# Patient Record
Sex: Male | Born: 1965 | Race: White | Hispanic: No | Marital: Married | Smoking: Never smoker
Health system: Southern US, Community
[De-identification: ages and names within clinical notes are randomized; demographics above are authoritative.]

## PROBLEM LIST (undated history)

## (undated) DIAGNOSIS — R42 Dizziness and giddiness: Secondary | ICD-10-CM

## (undated) DIAGNOSIS — E785 Hyperlipidemia, unspecified: Secondary | ICD-10-CM

## (undated) DIAGNOSIS — I493 Ventricular premature depolarization: Secondary | ICD-10-CM

## (undated) DIAGNOSIS — I491 Atrial premature depolarization: Secondary | ICD-10-CM

## (undated) HISTORY — DX: Atrial premature depolarization: I49.1

## (undated) HISTORY — DX: Dizziness and giddiness: R42

## (undated) HISTORY — PX: OTHER SURGICAL HISTORY: SHX169

## (undated) HISTORY — DX: Ventricular premature depolarization: I49.3

## (undated) HISTORY — DX: Hyperlipidemia, unspecified: E78.5

## (undated) HISTORY — PX: VASECTOMY: SHX75

---

## 2001-11-26 ENCOUNTER — Ambulatory Visit (HOSPITAL_COMMUNITY): Admission: RE | Admit: 2001-11-26 | Discharge: 2001-11-26 | Payer: Self-pay | Admitting: Orthopedic Surgery

## 2002-06-13 ENCOUNTER — Encounter: Admission: RE | Admit: 2002-06-13 | Discharge: 2002-06-13 | Payer: Self-pay | Admitting: Family Medicine

## 2002-10-30 ENCOUNTER — Encounter: Admission: RE | Admit: 2002-10-30 | Discharge: 2002-10-30 | Payer: Self-pay | Admitting: Family Medicine

## 2003-10-21 ENCOUNTER — Encounter: Admission: RE | Admit: 2003-10-21 | Discharge: 2003-10-21 | Payer: Self-pay | Admitting: Family Medicine

## 2005-01-31 ENCOUNTER — Emergency Department (HOSPITAL_COMMUNITY): Admission: EM | Admit: 2005-01-31 | Discharge: 2005-01-31 | Payer: Self-pay | Admitting: Family Medicine

## 2008-06-17 ENCOUNTER — Ambulatory Visit: Payer: Self-pay | Admitting: Internal Medicine

## 2008-07-02 ENCOUNTER — Ambulatory Visit: Payer: Self-pay | Admitting: Internal Medicine

## 2008-07-02 ENCOUNTER — Encounter: Payer: Self-pay | Admitting: Internal Medicine

## 2008-07-02 ENCOUNTER — Ambulatory Visit (HOSPITAL_COMMUNITY): Admission: RE | Admit: 2008-07-02 | Discharge: 2008-07-02 | Payer: Self-pay | Admitting: Internal Medicine

## 2010-07-07 DIAGNOSIS — D229 Melanocytic nevi, unspecified: Secondary | ICD-10-CM

## 2010-07-07 HISTORY — DX: Melanocytic nevi, unspecified: D22.9

## 2011-04-11 NOTE — H&P (Signed)
NAME:  Warren Barr, Warren Barr                ACCOUNT NO.:  [id number]   MEDICAL RECORD NO.:  [id number]          PATIENT TYPE:  AMB   LOCATION:  DAY                           FACILITY:  [facility]   PHYSICIAN:  R. Roetta Sessions, M.D. DATE OF BIRTH:  1966/11/22   DATE OF ADMISSION:  DATE OF DISCHARGE:  LH                              HISTORY & PHYSICAL   CHIEF COMPLAINT:  Blood on toilet tissue.   HISTORY OF PRESENT ILLNESS:  The patient is very pleasant 45 year old  Caucasian gentleman who presents today for further evaluation of  intermittent toilet tissue hematochezia.  He states a few years ago he  was told that he had hemorrhoids.  He has never had a colonoscopy.  He  has intermittent blood per rectum which is bright red.  He denies any  constipation, diarrhea, rectal pain, abdominal pain, nausea, vomiting,  heartburn, dysphagia, or odynophagia.  His weight has been stable.   CURRENT MEDICATIONS:  None.   ALLERGIES:  No known drug allergies.   PAST MEDICAL HISTORY:  He has had surgery on both legs.   FAMILY HISTORY:  Mother, age 21, has atrial fibrillation.  Father  committed suicide at age 80.  He has a twin brother who is healthy.  He  has 2 healthy sisters.  No family history of colorectal cancer or  colorectal polyps to his knowledge.   SOCIAL HISTORY:  He is married.  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  He is a nonsmoker.  No alcohol use.   REVIEW OF SYSTEMS:  See HPI for GI.  CONSTITUTIONAL:  Denies  unintentional weight loss.  CARDIOPULMONARY:  No chest pain, shortness  of breath, palpitations, or cough.  GENITOURINARY:  No dysuria or  hematuria.   PHYSICAL EXAMINATION:  VITALS SIGNS:  Weight 209, height 6 feet 2  inches, temperature 97.9, blood pressure 110/82, and pulse 80.  GENERAL:  Pleasant well-nourished, well-developed Caucasian male in no  acute distress.  SKIN:  Warm and dry.  No jaundice.  HEENT:  Sclerae nonicteric.  Oropharyngeal  mucosa moist and pink.  No  lesions, erythema, or exudate.  No lymphadenopathy or thyromegaly.  CHEST:  Lungs are clear to auscultation.  CARDIAC:  Regular rate and rhythm.  Normal S1 and S2.  No murmurs, rubs,  or gallops.  ABDOMEN:  Positive bowel sounds.  Soft, nontender, and nondistended.  No  organomegaly or masses.  No rebound or guarding.  No abdominal bruits or  hernias.  EXTREMITIES:  Lower extremities, no edema.   IMPRESSION:  Warren Barr is a pleasant 45 year old gentleman with  intermittent hematochezia likely due to a benign anorectal source such  as hemorrhoids.  I have offered him a colonoscopy today to exclude the  possibilities of polyps, ulcers, malignancy, etc.  I have discussed  risks, alternatives, and benefits with the patient with regards to the  risk of reaction to medication, bleeding, perforation, and infection and  he is agreeable to proceed.   PLAN:  Colonoscopy in the near future with Dr. Jena Gauss.  Warren Barr, P.AJonathon Bellows, M.D.  Electronically Signed    LL/MEDQ  D:  06/17/2008  T:  06/18/2008  Job:  [id number]

## 2011-04-11 NOTE — Op Note (Signed)
NAME:  Warren Barr, Warren Barr                ACCOUNT NO.:  [id number]   MEDICAL RECORD NO.:  [id number]          PATIENT TYPE:  AMB   LOCATION:  DAY                           FACILITY:  [facility]   PHYSICIAN:  R. Roetta Sessions, M.D. DATE OF BIRTH:  03/08/1966   DATE OF PROCEDURE:  07/02/2008  DATE OF DISCHARGE:                               OPERATIVE REPORT   PROCEDURE:  Ileocolonoscopy and snare polypectomy.   INDICATIONS FOR PROCEDURE:  A 45 year old gentleman with intermittent  paper hematochezia.  No family history of colorectal neoplasia or  polyps.  No history of prior imaging.  Colonoscopy is now being done as  a diagnostic maneuver.  Risks, benefits, alternatives, and limitations  have been reviewed and questions answered.  He is agreeable.  Please see  the documentation in the medical record.   PROCEDURE NOTE:  O2 saturation, blood pressure, pulse, and respirations  monitored throughout the entire procedure.   CONSCIOUS SEDATION:  Versed 7 mg IV and Demerol 125 mg IV in divided  doses.   INSTRUMENT:  Pentax video chip system.   FINDINGS:  Digital rectal exam revealed no abnormalities aside from a  single external hemorrhoid tag.  Endoscopic findings:  The prep was  good.  Colon:  Colonic mucosa was surveyed from the rectosigmoid  junction to the left transverse and right colon, to the appendiceal  orifice, ileocecal valve, and cecum.  These structures were well seen  and photographed for the record.  Terminal ileum was intubated to 10 cm.  From this level, scope was slowly cautiously withdrawn.  All previously  mentioned mucosal surfaces were again seen.  The patient had a 5-mm  pedunculated polyp in the mid descending colon.  It was cold snared and  recovered with the scope.  Remainder of the colonic mucosa appeared  normal.  The scope was pulled down to the rectum with examination of  rectal mucosa, including retroflexed view of the anal verge,  demonstrated no abnormalities.  The  patient tolerated the procedure well  and was reacted in Endoscopy.   IMPRESSION:  1. Single __________ hemorrhoidal tag, otherwise normal rectum.  2. Polyp in mid descending colon, status post snare removal as      described above.  Remainder of the colonic mucosa and terminal      mucosa appeared normal.   RECOMMENDATIONS:  1. Follow up on path.  2. Hemorrhoid and high-fiber diet literature provided to [name].      Specifically I have recommended a dose of      Benefiber daily.  3. A 10-day course of Anusol-HC Suppository one per rectum at bedtime.      He is to void straining and vigorous wiping.  Further      recommendations to follow.      Warren Barr, M.D.  Electronically Signed     RMR/MEDQ  D:  07/02/2008  T:  07/02/2008  Job:  [id number]   cc:   Warren Barr  Fax: [phone]

## 2011-04-14 NOTE — Procedures (Signed)
Warren Barr. [facility]  Patient:    Warren Barr, Warren Barr Visit Number: [id number] MRN: [id number]          Service Type: DSU Location: DAY Attending Physician:  Sherri Rad Dictated by:   Sherri Rad, M.D. Proc. 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  PROCEDURE:  Bilateral gastrocnemius slide.  ANESTHESIA:  General endotracheal tube.  SURGEON:  Sherri Rad, M.D.  ASSISTANTJill Side P. Mahar, P.A.  ESTIMATED BLOOD LOSS:  Minimal.  TOURNIQUET:  None.  COMPLICATIONS:  None.  DISPOSITION:  Stable to PAR.  INDICATION:  This is a 45 year old gentleman who has had bilateral plantar medial heel pain for almost one year.  He has tried all types of conservative management, which includes cortisone heel injections, stretching, physical therapy, anti-inflammatories, heel cups, night splints, all of which have not helped.  He works in the [employer] and is on his feet throughout the day. He has tried mobilization in a Personal assistant during this time, which has not helped as well.  He was explained his options, which include surgery, which would be either gastroc slide versus plantar fascial release versus orthotripsy.  He was explained the difference of the three and chose gastroc slide at this point.  He wanted to have both done as well at this time, for they both hurt about the same.  He was explained the risks of the procedure, which include infection, nerve or vessel injury, especially sural nerve numbness, pain, worsening of pain, the persistent heel pain that would slowly abate over time most likely.  The possibility that his heel pain may actually continue was also explained as  well.  He was also explained that he may need future surgical procedures if this did not work.  He was in agreement with the above, and all questions were answered.  He was also told that he was going to have some slight weakness in his calf muscle as well that may not be interpretable.  DESCRIPTION OF PROCEDURE:  Patient brought to the operating room and placed in supine position.  After adequate general endotracheal tube anesthesia was administered as well as Ancef 1 g IV piggyback, bilateral lower extremities were prepped and draped in a sterile manner without thigh tourniquets.  We started the procedure on the right lower extremity with a longitudinal incision at the musculotendinous junction of the gastrocnemius on the medial border.  Dissection was carried down through skin.  Hemostasis was obtained. The fascia was then opened in the line of the incision.  The interval between the gastroc-soleus conjoined tendon region was identified and developed.  The sural nerve was identified on the posterior soft tissue structures and elevated off the posterior aspect of the gastroc tendinous area and was protected with retractor throughout the case.  Then with the Mayo scissors, the gastroc tendon was then released.  This had an excellent release of the tight gastroc with improvement of about 10-15 degrees of dorsiflexion with the knee extended beyond neutral.  The wound was copiously irrigated with normal saline.  Skin was closed with 3-0 subcu and 4-0 Monocryl subcuticular stitch. A similar exact procedure was done  on the opposite extremity.  Sterile dressings were applied.  Cam walker boots were applied.  The patient was stable to the PAR. Dictated by:   Sherri Rad, M.D. Attending Physician:  Sherri Rad DD:  11/26/01 TD:  11/26/01 Job: [id number]

## 2012-01-30 DIAGNOSIS — F411 Generalized anxiety disorder: Secondary | ICD-10-CM | POA: Insufficient documentation

## 2012-02-16 ENCOUNTER — Institutional Professional Consult (permissible substitution): Payer: Self-pay | Admitting: Cardiology

## 2012-02-26 ENCOUNTER — Encounter: Payer: Self-pay | Admitting: Cardiovascular Disease

## 2012-02-26 ENCOUNTER — Ambulatory Visit (INDEPENDENT_AMBULATORY_CARE_PROVIDER_SITE_OTHER): Payer: BC Managed Care – PPO | Admitting: Cardiovascular Disease

## 2012-02-26 VITALS — BP 129/79 | HR 71 | Ht 74.0 in | Wt 208.0 lb

## 2012-02-26 DIAGNOSIS — R002 Palpitations: Secondary | ICD-10-CM

## 2012-02-26 NOTE — Patient Instructions (Signed)
Your physician recommends that you schedule a follow-up appointment in: 2-3 weeks.   Your physician has requested that you have an echocardiogram. Echocardiography is a painless test that uses sound waves to create images of your heart. It provides your doctor with information about the size and shape of your heart and how well your heart's chambers and valves are working. This procedure takes approximately one hour. There are no restrictions for this procedure.   Your physician has recommended that you wear a holter monitor. Holter monitors are medical devices that record the heart's electrical activity. Doctors most often use these monitors to diagnose arrhythmias. Arrhythmias are problems with the speed or rhythm of the heartbeat. The monitor is a small, portable device. You can wear one while you do your normal daily activities. This is usually used to diagnose what is causing palpitations/syncope (passing out).    

## 2012-02-26 NOTE — Assessment & Plan Note (Signed)
Likely premature beats. Will arrange 48 hour Holter monitor and echo to exclude structural heart disease. He does not have risk factors for CAD.

## 2012-02-26 NOTE — Progress Notes (Signed)
   History of Present Illness: 46 yo male with no past medical history who is referred today for evaluation of chest pain. He tells me that he has had several episodes of SOB, sudden occurrence. This has happened mostly at rest. He had one episode of chest tightness while driving. This did not feel like acid reflux. This occurred in January and has not recurred. He does notice his heart seems to be fluttering during these episodes. No near syncope or syncope.   Primary Care Physician: Almond Lint  Last Lipid Profile:  Past Medical History  Diagnosis Date  . Chest tightness   . Dizziness   . Anxiety states     Past Surgica l History  Procedure Date  . Plantar fasciitis     Bilateral    No current outpatient prescriptions on file.    No Known Allergies  History   Social History  . Marital Status: Married    Spouse Name: N/A    Number of Children: 2  . Years of Education: N/A   Occupational History  . Occupational hygienist   Social History Main Topics  . Smoking status: Never Smoker   . Smokeless tobacco: Not on file  . Alcohol Use: No  . Drug Use: No  . Sexually Active: Not on file   Other Topics Concern  . Not on file   Social History Narrative  . No narrative on file    Family History  Problem Relation Age of Onset  . Heart failure Father     Review of Systems:  As stated in the HPI and otherwise negative.   BP 129/79  Pulse 71  Ht 6\' 2"  (1.88 m)  Wt 208 lb (94.348 kg)  BMI 26.71 kg/m2  Physical Examination: General: Well developed, well nourished, NAD HEENT: OP clear, mucus membranes moist SKIN: warm, dry. No rashes. Neuro: No focal deficits Musculoskeletal: Muscle strength 5/5 all ext Psychiatric: Mood and affect normal Neck: No JVD, no carotid bruits, no thyromegaly, no lymphadenopathy. Lungs:Clear bilaterally, no wheezes, rhonci, crackles Cardiovascular: Regular rate and rhythm. No murmurs, gallops or rubs. Abdomen:Soft. Bowel  sounds present. Non-tender.  Extremities: No lower extremity edema. Pulses are 2 + in the bilateral DP/PT.  EKG: NSR, rate 71 bpm. Normal EKG.

## 2012-02-29 ENCOUNTER — Encounter (INDEPENDENT_AMBULATORY_CARE_PROVIDER_SITE_OTHER): Payer: BC Managed Care – PPO

## 2012-02-29 ENCOUNTER — Ambulatory Visit (HOSPITAL_COMMUNITY): Payer: BC Managed Care – PPO | Attending: Cardiovascular Disease

## 2012-02-29 ENCOUNTER — Other Ambulatory Visit: Payer: Self-pay

## 2012-02-29 DIAGNOSIS — R002 Palpitations: Secondary | ICD-10-CM | POA: Insufficient documentation

## 2012-03-05 ENCOUNTER — Encounter: Payer: Self-pay | Admitting: Cardiology

## 2012-03-12 ENCOUNTER — Telehealth: Payer: Self-pay | Admitting: *Deleted

## 2012-03-12 NOTE — Telephone Encounter (Signed)
Fu call °Patient returning your call °

## 2012-03-12 NOTE — Telephone Encounter (Signed)
Spoke with pt and reviewed monitor results with pt.

## 2012-03-12 NOTE — Telephone Encounter (Signed)
Called pt to review monitor results. Left message to call back 

## 2012-03-28 ENCOUNTER — Ambulatory Visit (INDEPENDENT_AMBULATORY_CARE_PROVIDER_SITE_OTHER): Payer: BC Managed Care – PPO | Admitting: Cardiovascular Disease

## 2012-03-28 ENCOUNTER — Ambulatory Visit: Payer: BC Managed Care – PPO | Admitting: Cardiovascular Disease

## 2012-03-28 ENCOUNTER — Encounter: Payer: Self-pay | Admitting: Cardiovascular Disease

## 2012-03-28 VITALS — BP 128/84 | HR 80 | Ht 74.0 in | Wt 207.0 lb

## 2012-03-28 DIAGNOSIS — I493 Ventricular premature depolarization: Secondary | ICD-10-CM

## 2012-03-28 DIAGNOSIS — R002 Palpitations: Secondary | ICD-10-CM

## 2012-03-28 DIAGNOSIS — I4949 Other premature depolarization: Secondary | ICD-10-CM

## 2012-03-28 MED ORDER — DILTIAZEM HCL ER COATED BEADS 120 MG PO CP24: 120.0000 mg | ORAL_CAPSULE | Freq: Every day | ORAL | Status: DC

## 2012-03-28 MED ORDER — ALPRAZOLAM 0.25 MG PO TABS: ORAL_TABLET | ORAL | Status: DC

## 2012-03-28 NOTE — Progress Notes (Signed)
   History of Present Illness: 46 yo male with no past medical history who is here today for cardiac follow up. I saw him one month ago for evaluation of chest pain. He told me that he has had several episodes of SOB, sudden occurrence. This has happened mostly at rest. He had one episode of chest tightness while driving. This did not feel like acid reflux. This occurred in January and did not  recur. He does notice his heart seems to be fluttering during these episodes. No near syncope or syncope. I arranged an echo which showed normal LV systolic function with LVEF of 60%. No valvular disease. His 48 hour Holter monitor showed NSR with 23,000 PVCs and 240 PACs.  He tells me today that he is still feeling his heart racing. He is under much stress as a Engineer, site. No chest pain or SOB. No near syncope or syncope.   Primary Care Physician: Donzetta Sprung   Past Medical History  Diagnosis Date  . Chest tightness   . Dizziness   . Anxiety states     Past Surgical History  Procedure Date  . Plantar fasciitis     Bilateral    No current outpatient prescriptions on file.    No Known Allergies  History   Social History  . Marital Status: Married    Spouse Name: N/A    Number of Children: 2  . Years of Education: N/A   Occupational History  . Occupational hygienist   Social History Main Topics  . Smoking status: Never Smoker   . Smokeless tobacco: Not on file  . Alcohol Use: No  . Drug Use: No  . Sexually Active: Not on file   Other Topics Concern  . Not on file   Social History Narrative  . No narrative on file    Family History  Problem Relation Age of Onset  . Heart failure Father     Review of Systems:  As stated in the HPI and otherwise negative.   BP 128/84  Pulse 80  Ht 6\' 2"  (1.88 m)  Wt 207 lb (93.895 kg)  BMI 26.58 kg/m2  Physical Examination: General: Well developed, well nourished, NAD HEENT: OP clear, mucus membranes moist SKIN: warm,  dry. No rashes. Neuro: No focal deficits Musculoskeletal: Muscle strength 5/5 all ext Psychiatric: Mood and affect normal Neck: No JVD, no carotid bruits, no thyromegaly, no lymphadenopathy. Lungs:Clear bilaterally, no wheezes, rhonci, crackles Cardiovascular: Regular rate and rhythm. No murmurs, gallops or rubs. Abdomen:Soft. Bowel sounds present. Non-tender.  Extremities: No lower extremity edema. Pulses are 2 + in the bilateral DP/PT.  48 Holter monitor: Normal sinus rhythm. 65,784 PVCs. 240 PACs. No VT.

## 2012-03-28 NOTE — Assessment & Plan Note (Signed)
Will start Cardizem CD 120 mg po Qdaily. He will let us know if this does not reduce his symptoms. We will adjust dose as needed. Will also give him Xanax 0.25 mg po Q6hours prn anxiety which is playing a role in this.

## 2012-03-28 NOTE — Patient Instructions (Signed)
Your physician wants you to follow-up in: 12 months. You will receive a reminder letter in the mail two months in advance. If you don't receive a letter, please call our office to schedule the follow-up appointment.  Your physician has recommended you make the following change in your medication: Start Cardizem CD 120 mg by mouth daily.   

## 2012-05-07 ENCOUNTER — Encounter: Payer: Self-pay | Admitting: Cardiovascular Disease

## 2012-05-16 ENCOUNTER — Telehealth: Payer: Self-pay | Admitting: Cardiovascular Disease

## 2012-05-16 NOTE — Telephone Encounter (Signed)
Please return call to patient at [phone], concerning headaches.

## 2012-05-16 NOTE — Telephone Encounter (Signed)
Patient said that for the last 7 to 10 days he has been getting a boderson headache in the middle of the day. Pt checks his BP and heart rate and it runs normal. Allso he drinks plenty of water. Patient thinks that is the Cardizem 120 mg  He takes this  Medication once a day for about forty days. Pt is aware that I will send this message to the MD.

## 2012-05-17 NOTE — Telephone Encounter (Signed)
OK to have him hold his Cardizem for a few days and see if the headaches go away. If his palpitations worsen, we could try a beta blocker instead, would probably try Toprol XL 25 mg po Qdaily. Thanks, chris

## 2012-05-17 NOTE — Telephone Encounter (Signed)
Call back number listed below is not correct. I called pt at home number and gave him instructions from Dr. Clifton James. He will let us know if headaches improve off Cardizem and if palpitations are bothersome and he would like to change to Toprol.

## 2013-04-03 ENCOUNTER — Other Ambulatory Visit: Payer: Self-pay

## 2013-04-03 ENCOUNTER — Encounter: Payer: Self-pay | Admitting: Cardiovascular Disease

## 2013-04-03 ENCOUNTER — Ambulatory Visit (INDEPENDENT_AMBULATORY_CARE_PROVIDER_SITE_OTHER): Payer: BC Managed Care – PPO | Admitting: Cardiovascular Disease

## 2013-04-03 VITALS — BP 124/79 | HR 73 | Ht 74.0 in | Wt 214.0 lb

## 2013-04-03 DIAGNOSIS — I493 Ventricular premature depolarization: Secondary | ICD-10-CM

## 2013-04-03 DIAGNOSIS — I4949 Other premature depolarization: Secondary | ICD-10-CM

## 2013-04-03 DIAGNOSIS — F419 Anxiety disorder, unspecified: Secondary | ICD-10-CM

## 2013-04-03 DIAGNOSIS — F411 Generalized anxiety disorder: Secondary | ICD-10-CM

## 2013-04-03 DIAGNOSIS — R002 Palpitations: Secondary | ICD-10-CM

## 2013-04-03 MED ORDER — DILTIAZEM HCL ER COATED BEADS 120 MG PO CP24: 120.0000 mg | ORAL_CAPSULE | Freq: Every day | ORAL | Status: DC

## 2013-04-03 MED ORDER — ALPRAZOLAM 0.25 MG PO TABS: ORAL_TABLET | ORAL | Status: DC

## 2013-04-03 NOTE — Patient Instructions (Addendum)
Your physician wants you to follow-up in:  12 months.  You will receive a reminder letter in the mail two months in advance. If you don't receive a letter, please call our office to schedule the follow-up appointment.   

## 2013-04-03 NOTE — Progress Notes (Signed)
   History of Present Illness: 47 yo male with history of chest pain, palpitations secondary to PVCs/PACs who is here today for cardiac follow up. I saw him as a new patient 02/26/12 for evaluation of chest pain and palpitations. I arranged an echo which showed normal LV systolic function with LVEF of 60%. No valvular disease. His 48 hour Holter monitor showed NSR with 23,000 PVCs and 240 PACs.   He is here today for follow up. He is feeling great. No chest pain or SOB. He tells me today that he is still feeling his heart racing. 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   Primary Care Physician: Donzetta Sprung  Past Medical History  Diagnosis Date  . Chest tightness   . Dizziness   . Anxiety states   . PVC (premature ventricular contraction)     Past Surgical History  Procedure Laterality Date  . Plantar fasciitis      Bilateral    Current Outpatient Prescriptions  Medication Sig Dispense Refill  . ALPRAZolam (XANAX) 0.25 MG tablet Take one every 6 hours as needed for anxiety  30 tablet  2  . diltiazem (CARDIZEM CD) 120 MG 24 hr capsule Take 1 capsule (120 mg total) by mouth daily.  30 capsule  11   No current facility-administered medications for this visit.    No Known Allergies  History   Social History  . Marital Status: Married    Spouse Name: N/A    Number of Children: 2  . Years of Education: N/A   Occupational History  . Occupational hygienist   Social History Main Topics  . Smoking status: Never Smoker   . Smokeless tobacco: Not on file  . Alcohol Use: No  . Drug Use: No  . Sexually Active: Not on file   Other Topics Concern  . Not on file   Social History Narrative  . No narrative on file    Family History  Problem Relation Age of Onset  . Heart failure Father     Review of Systems:  As stated in the HPI and otherwise negative.   BP 124/79  Pulse 73  Ht 6\' 2"  (1.88 m)  Wt 214 lb (97.07 kg)  BMI 27.46  kg/m2  Physical Examination: General: Well developed, well nourished, NAD HEENT: OP clear, mucus membranes moist SKIN: warm, dry. No rashes. Neuro: No focal deficits Musculoskeletal: Muscle strength 5/5 all ext Psychiatric: Mood and affect normal Neck: No JVD, no carotid bruits, no thyromegaly, no lymphadenopathy. Lungs:Clear bilaterally, no wheezes, rhonci, crackles Cardiovascular: Regular rate and rhythm. No murmurs, gallops or rubs. Abdomen:Soft. Bowel sounds present. Non-tender.  Extremities: No lower extremity edema. Pulses are 2 + in the bilateral DP/PT.  EKG: NSR, rate 71 bpm. Incomplete RBBB.   Assessment and Plan:   1. Palpitations: known to have PVCs/PACs. Continue Cardizem CD 120 mg po Qdaily. He uses Xanax prn for anxiety.

## 2013-07-03 ENCOUNTER — Other Ambulatory Visit: Payer: Self-pay | Admitting: Dermatology

## 2014-04-27 ENCOUNTER — Other Ambulatory Visit: Payer: Self-pay | Admitting: Cardiovascular Disease

## 2014-04-30 ENCOUNTER — Ambulatory Visit (INDEPENDENT_AMBULATORY_CARE_PROVIDER_SITE_OTHER): Payer: BC Managed Care – PPO | Admitting: Cardiovascular Disease

## 2014-04-30 ENCOUNTER — Encounter: Payer: Self-pay | Admitting: Cardiovascular Disease

## 2014-04-30 VITALS — BP 106/66 | HR 65 | Ht 74.0 in | Wt 215.0 lb

## 2014-04-30 DIAGNOSIS — R002 Palpitations: Secondary | ICD-10-CM

## 2014-04-30 DIAGNOSIS — I493 Ventricular premature depolarization: Secondary | ICD-10-CM

## 2014-04-30 DIAGNOSIS — F411 Generalized anxiety disorder: Secondary | ICD-10-CM

## 2014-04-30 DIAGNOSIS — F419 Anxiety disorder, unspecified: Secondary | ICD-10-CM

## 2014-04-30 DIAGNOSIS — I4949 Other premature depolarization: Secondary | ICD-10-CM

## 2014-04-30 MED ORDER — DILTIAZEM HCL ER COATED BEADS 120 MG PO CP24: ORAL_CAPSULE | ORAL | Status: DC

## 2014-04-30 MED ORDER — ALPRAZOLAM 0.25 MG PO TABS: ORAL_TABLET | ORAL | Status: DC

## 2014-04-30 NOTE — Progress Notes (Signed)
    History of Present Illness: 48 yo male with history of chest pain, palpitations secondary to PVCs/PACs who is here today for cardiac follow up. I saw him as a new patient 02/26/12 for evaluation of chest pain and palpitations. I arranged an echo which showed normal LV systolic function with LVEF of 60%. No valvular disease. His 48 hour Holter monitor showed NSR with 23,000 PVCs and 240 PACs.   He is here today for follow up. He is feeling great. No chest pain or SOB. He has rare palpitations. He is a Education officer, museum. No near syncope or syncope.   Primary Care Physician: Gar Ponto  Past Medical History  Diagnosis Date  . Chest tightness   . Dizziness   . Anxiety states   . PVC (premature ventricular contraction)     Past Surgical History  Procedure Laterality Date  . Plantar fasciitis      Bilateral    Current Outpatient Prescriptions  Medication Sig Dispense Refill  . ALPRAZolam (XANAX) 0.25 MG tablet Take one every 6 hours as needed for anxiety  60 tablet  2  . CARTIA XT 120 MG 24 hr capsule TAKE 1 CAPSULE BY MOUTH DAILY  30 capsule  0   No current facility-administered medications for this visit.    No Known Allergies  History   Social History  . Marital Status: Married    Spouse Name: N/A    Number of Children: 2  . Years of Education: N/A   Occupational History  . Chief Technology Officer   Social History Main Topics  . Smoking status: Never Smoker   . Smokeless tobacco: Not on file  . Alcohol Use: No  . Drug Use: No  . Sexual Activity: Not on file   Other Topics Concern  . Not on file   Social History Narrative  . No narrative on file    Family History  Problem Relation Age of Onset  . Heart failure Father     Review of Systems:  As stated in the HPI and otherwise negative.   BP 106/66  Pulse 65  Ht 6\' 2"  (1.88 m)  Wt 215 lb (97.523 kg)  BMI 27.59 kg/m2  Physical Examination: General: Well developed, well nourished, NAD HEENT: OP  clear, mucus membranes moist SKIN: warm, dry. No rashes. Neuro: No focal deficits Musculoskeletal: Muscle strength 5/5 all ext Psychiatric: Mood and affect normal Neck: No JVD, no carotid bruits, no thyromegaly, no lymphadenopathy. Lungs:Clear bilaterally, no wheezes, rhonci, crackles Cardiovascular: Regular rate and rhythm. No murmurs, gallops or rubs. Abdomen:Soft. Bowel sounds present. Non-tender.  Extremities: No lower extremity edema. Pulses are 2 + in the bilateral DP/PT.  EKG: NSR, rate 65 bpm. Incomplete RBBB.   Assessment and Plan:   1. Palpitations: known to have PVCs/PACs. Continue Cardizem CD 120 mg po Qdaily. He uses Xanax prn for anxiety.   2. Anxiety: Will continue Xanax prn.

## 2014-04-30 NOTE — Patient Instructions (Signed)
Your physician wants you to follow-up in:  12 months.  You will receive a reminder letter in the mail two months in advance. If you don't receive a letter, please call our office to schedule the follow-up appointment.   

## 2014-05-19 ENCOUNTER — Other Ambulatory Visit: Payer: Self-pay | Admitting: Physician Assistant

## 2014-07-03 ENCOUNTER — Other Ambulatory Visit: Payer: Self-pay | Admitting: Cardiovascular Disease

## 2015-01-27 ENCOUNTER — Other Ambulatory Visit: Payer: Self-pay | Admitting: Dermatology

## 2015-04-06 ENCOUNTER — Telehealth: Payer: Self-pay | Admitting: Cardiovascular Disease

## 2015-04-06 NOTE — Telephone Encounter (Signed)
Per Dr. Angelena Form OK to proceed. Paperwork completed and faxed to [facility].  Message left on Wilda's voicemail with this information.

## 2015-04-06 NOTE — Telephone Encounter (Signed)
Request for clearance:  Request for a approval to use bio feed back to rehabilitate his pelvic floor muscles. She will fax the request

## 2015-05-21 ENCOUNTER — Ambulatory Visit (INDEPENDENT_AMBULATORY_CARE_PROVIDER_SITE_OTHER): Payer: BC Managed Care – PPO | Admitting: Cardiovascular Disease

## 2015-05-21 ENCOUNTER — Encounter: Payer: Self-pay | Admitting: Cardiovascular Disease

## 2015-05-21 VITALS — BP 118/78 | HR 71 | Ht 74.0 in | Wt 211.2 lb

## 2015-05-21 DIAGNOSIS — R002 Palpitations: Secondary | ICD-10-CM

## 2015-05-21 DIAGNOSIS — I493 Ventricular premature depolarization: Secondary | ICD-10-CM | POA: Diagnosis not present

## 2015-05-21 DIAGNOSIS — F419 Anxiety disorder, unspecified: Secondary | ICD-10-CM | POA: Diagnosis not present

## 2015-05-21 MED ORDER — ALPRAZOLAM 0.25 MG PO TABS
ORAL_TABLET | ORAL | Status: DC
Start: 2015-05-21 — End: 2015-12-03

## 2015-05-21 MED ORDER — DILTIAZEM HCL ER COATED BEADS 120 MG PO CP24: 120.0000 mg | ORAL_CAPSULE | Freq: Every day | ORAL | Status: DC

## 2015-05-21 NOTE — Patient Instructions (Signed)
Medication Instructions:  Your physician recommends that you continue on your current medications as directed. Please refer to the Current Medication list given to you today.   Labwork: none  Testing/Procedures: none  Follow-Up: Your physician wants you to follow-up in:  12 months.  You will receive a reminder letter in the mail two months in advance. If you don't receive a letter, please call our office to schedule the follow-up appointment.        

## 2015-05-21 NOTE — Progress Notes (Signed)
Chief Complaint  Patient presents with  . Follow-up     History of Present Illness: 49 yo male with history of chest pain, palpitations secondary to PVCs/PACs who is here today for cardiac follow up. I saw him as a new patient 02/26/12 for evaluation of chest pain and palpitations. I arranged an echo which showed normal LV systolic function with LVEF of 60%. No valvular disease. His 48 hour Holter monitor showed NSR with 23,000 PVCs and 240 PACs.   He is here today for follow up. He is feeling great. No chest pain or SOB. He has rare palpitations. No near syncope or syncope.   Primary Care Physician: [facility]   Past Medical History  Diagnosis Date  . Chest tightness   . Dizziness   . Anxiety states   . PVC (premature ventricular contraction)     Past Surgical History  Procedure Laterality Date  . Plantar fasciitis      Bilateral    Current Outpatient Prescriptions  Medication Sig Dispense Refill  . ALPRAZolam (XANAX) 0.25 MG tablet Take one every 6 hours as needed for anxiety 60 tablet 3  . diltiazem (CARDIZEM CD) 120 MG 24 hr capsule TAKE 1 CAPSULE BY MOUTH DAILY     No current facility-administered medications for this visit.    No Known Allergies  History   Social History  . Marital Status: Married    Spouse Name: N/A  . Number of Children: 2  . Years of Education: N/A   Occupational History  . Chief Technology Officer   Social History Main Topics  . Smoking status: Never Smoker   . Smokeless tobacco: Not on file  . Alcohol Use: No  . Drug Use: No  . Sexual Activity: Not on file   Other Topics Concern  . Not on file   Social History Narrative    Family History  Problem Relation Age of Onset  . Heart failure Father     Review of Systems:  As stated in the HPI and otherwise negative.   BP 118/78 mmHg  Pulse 71  Ht 6\' 2"  (1.88 m)  Wt 211 lb 3.2 oz (95.8 kg)  BMI 27.11 kg/m2  SpO2 98%  Physical Examination: General: Well developed, well  nourished, NAD HEENT: OP clear, mucus membranes moist SKIN: warm, dry. No rashes. Neuro: No focal deficits Musculoskeletal: Muscle strength 5/5 all ext Psychiatric: Mood and affect normal Neck: No JVD, no carotid bruits, no thyromegaly, no lymphadenopathy. Lungs:Clear bilaterally, no wheezes, rhonci, crackles Cardiovascular: Regular rate and rhythm. No murmurs, gallops or rubs. Abdomen:Soft. Bowel sounds present. Non-tender.  Extremities: No lower extremity edema. Pulses are 2 + in the bilateral DP/PT.  EKG:  EKG is ordered today. The ekg ordered today demonstrates NSR, rate 71 bpm. Incomplete RBBB  Recent Labs: No results found for requested labs within last 365 days.   Lipid Panel No results found for: CHOL, TRIG, HDL, CHOLHDL, VLDL, LDLCALC, LDLDIRECT   Wt Readings from Last 3 Encounters:  05/21/15 211 lb 3.2 oz (95.8 kg)  04/30/14 215 lb (97.523 kg)  04/03/13 214 lb (97.07 kg)     Other studies Reviewed: Additional studies/ records that were reviewed today include: . Review of the above records demonstrates:    Assessment and Plan:   1. Palpitations: known to have PVCs/PACs. Continue Cardizem CD 120 mg po Qdaily. He uses Xanax prn for anxiety.   2. Anxiety: Will continue Xanax prn.  Current medicines are  reviewed at length with the patient today.  The patient does not have concerns regarding medicines.  The following changes have been made:  no change  Labs/ tests ordered today include:  No orders of the defined types were placed in this encounter.    Disposition:   FU with me in 12  months  Signed, Lauree Chandler, MD 05/21/2015 3:01 PM    [facility] [place], [place], [place]  [address] Phone: [phone]; Fax: [phone]

## 2015-05-26 ENCOUNTER — Ambulatory Visit: Payer: BC Managed Care – PPO | Admitting: Cardiovascular Disease

## 2015-05-28 ENCOUNTER — Other Ambulatory Visit: Payer: Self-pay

## 2015-11-18 ENCOUNTER — Other Ambulatory Visit: Payer: Self-pay

## 2015-11-18 ENCOUNTER — Ambulatory Visit (INDEPENDENT_AMBULATORY_CARE_PROVIDER_SITE_OTHER): Payer: BC Managed Care – PPO | Admitting: Nurse Practitioner

## 2015-11-18 ENCOUNTER — Encounter: Payer: Self-pay | Admitting: Nurse Practitioner

## 2015-11-18 VITALS — BP 126/82 | HR 87 | Temp 97.3°F | Ht 74.0 in | Wt 210.6 lb

## 2015-11-18 DIAGNOSIS — K6289 Other specified diseases of anus and rectum: Secondary | ICD-10-CM | POA: Diagnosis not present

## 2015-11-18 DIAGNOSIS — K921 Melena: Secondary | ICD-10-CM | POA: Diagnosis not present

## 2015-11-18 MED ORDER — PEG 3350-KCL-NA BICARB-NACL 420 G PO SOLR: 4000.0000 mL | ORAL | Status: DC

## 2015-11-18 NOTE — Progress Notes (Signed)
Primary Care Physician:  No PCP Per Patient Primary Gastroenterologist:  Dr. Gala Romney  Chief Complaint  Patient presents with  . Rectal Pain  . set up TCS    HPI:   49 year old male presents for chief complaint of rectal pain and possible need for colonoscopy. Records provided with his chart are notes from [facility] with no indication of rectal pain or need for colonoscopy). There was a CT stone protocol from 12/21/2014 which found hepatobiliary unremarkable stomach and bowel unremarkable, impression of no findings to explain patient's symptoms. While taking through the electronic medical record it was noted he was seen by our practice in 2009 for toilet tissue hematochezia and has subsequent colonoscopy which found a single hemorrhoid tag, otherwise normal rectum. Polyp mid descending colon status post removal with the remainder of the colonic and terminal mucosa normal. He was given ten-day Anusol suppository course. Surgical pathology found hyperplastic polyp.  Today he states his rectal pain started about a year ago. Saw urgent care due to inability to urinate. Followed up with urology who has been seeing him. Has worsening perineal pain and rectal when sitting on hard surfaces. Urology diagnosed him with pelvic floor pain, completed physical therapy with significant improvement. Is also having toilet tissue hematochezia which also began again in the past year. Denies melena, abdominal pain, N/V, fever, chills, unintentional weight loss. Denies chest pain, dyspnea, dizziness, lightheadedness, syncope, near syncope. Denies any other upper or lower GI symptoms.  Past Medical History  Diagnosis Date  . Chest tightness   . Dizziness   . Anxiety states   . PVC (premature ventricular contraction)     Past Surgical History  Procedure Laterality Date  . Plantar fasciitis      Bilateral    Current Outpatient Prescriptions  Medication Sig Dispense Refill  . ALPRAZolam (XANAX) 0.25  MG tablet Take one every 6 hours as needed for anxiety 30 tablet 3  . diltiazem (CARDIZEM CD) 120 MG 24 hr capsule Take 1 capsule (120 mg total) by mouth daily. 30 capsule 11   No current facility-administered medications for this visit.    Allergies as of 11/18/2015  . (No Known Allergies)    Family History  Problem Relation Age of Onset  . Heart failure Father     Social History   Social History  . Marital Status: Married    Spouse Name: N/A  . Number of Children: 2  . Years of Education: N/A   Occupational History  . Chief Technology Officer   Social History Main Topics  . Smoking status: Never Smoker   . Smokeless tobacco: Not on file  . Alcohol Use: No  . Drug Use: No  . Sexual Activity: Not on file   Other Topics Concern  . Not on file   Social History Narrative    Review of Systems: General: Negative for anorexia, weight loss, fever, chills, fatigue, weakness. Eyes: Negative for vision changes.  ENT: Negative for hoarseness, difficulty swallowing. CV: Negative for chest pain, angina, palpitations, peripheral edema.  Respiratory: Negative for dyspnea at rest, cough, sputum, wheezing.  GI: See history of present illness. Derm: Negative for rash or itching.  Endo: Negative for unusual weight change.  Heme: Negative for bruising or bleeding. Allergy: Negative for rash or hives.    Physical Exam: BP 126/82 mmHg  Pulse 87  Temp(Src) 97.3 F (36.3 C)  Ht 6\' 2"  (1.88 m)  Wt 210 lb 9.6 oz (95.528 kg)  BMI 27.03 kg/m2 General:   Alert and oriented. Pleasant and cooperative. Well-nourished and well-developed.  Head:  Normocephalic and atraumatic. Eyes:  Without icterus, sclera clear and conjunctiva pink.  Ears:  Normal auditory acuity. Cardiovascular:  S1, S2 present without murmurs appreciated. Extremities without clubbing or edema. Respiratory:  Clear to auscultation bilaterally. No wheezes, rales, or rhonchi. No distress.  Gastrointestinal:  +BS,  soft, non-tender and non-distended. No HSM noted. No guarding or rebound. No masses appreciated.  Rectal:  Deferred  Skin:  Intact without significant lesions or rashes. Neurologic:  Alert and oriented x4;  grossly normal neurologically. Psych:  Alert and cooperative. Normal mood and affect. Heme/Lymph/Immune: No excessive bruising noted.    11/18/2015 10:57 AM

## 2015-11-18 NOTE — Assessment & Plan Note (Signed)
Patient with rectal bleeding at last colonoscopy approximately 9 years ago which is deemed due to hemorrhoids. His symptoms subsided and if not been recurrent since that time until approximately 1 year ago when he began having the lower abdominal, rectal, perineal pain. Given his symptoms, the time since his last colonoscopy, and the fact he is on was 49 years old proceed with a colonoscopy to further evaluate his symptoms. Hematochezia likely due to benign anorectal source however given his other symptoms cannot rule out other more insidious pathology.  Proceed with TCS with Dr. Gala Romney in near future: the risks, benefits, and alternatives have been discussed with the patient in detail. The patient states understanding and desires to proceed.  The patient is on when necessary Xanax, no anticoagulants, other anxiolytics, chronic pain medications. Drinks one glass of wine about 2 nights a week. His last procedure was completed with conscious sedation and this should again be adequate for his procedure.

## 2015-11-18 NOTE — Patient Instructions (Signed)
1. We will schedule your procedure for you. 2. Further recommendations to be based on results your procedure. 3. Return for follow-up as needed based on recommendations.

## 2015-11-18 NOTE — Assessment & Plan Note (Signed)
Patient with rectal pain and perineal pain for the past year. 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 Has completed physical therapy with some improvement in symptoms but symptoms do persist. Given the fact that his hematochezia has recurred about the same time as his rectal pain and his colonoscopy approximately 9 years ago and he is almost 49 years old we'll proceed with a colonoscopy for further evaluation. Return for follow-up based on recommendations from procedure.

## 2015-11-18 NOTE — Progress Notes (Signed)
No pcp per patient 

## 2015-12-13 ENCOUNTER — Telehealth: Payer: Self-pay | Admitting: Internal Medicine

## 2015-12-13 ENCOUNTER — Ambulatory Visit (HOSPITAL_COMMUNITY)
Admission: RE | Admit: 2015-12-13 | Discharge: 2015-12-13 | Disposition: A | Discharge status: Home / Self Care | Payer: BC Managed Care – PPO | Source: Ambulatory Visit | Attending: Internal Medicine | Admitting: Internal Medicine

## 2015-12-13 ENCOUNTER — Encounter (HOSPITAL_COMMUNITY)
Admission: RE | Disposition: A | Discharge status: Home / Self Care | Payer: Self-pay | Source: Ambulatory Visit | Attending: Internal Medicine

## 2015-12-13 ENCOUNTER — Encounter (HOSPITAL_COMMUNITY): Payer: Self-pay | Admitting: *Deleted

## 2015-12-13 DIAGNOSIS — Z860100 Personal history of colon polyps, unspecified: Secondary | ICD-10-CM | POA: Insufficient documentation

## 2015-12-13 DIAGNOSIS — K6289 Other specified diseases of anus and rectum: Secondary | ICD-10-CM | POA: Diagnosis present

## 2015-12-13 DIAGNOSIS — K921 Melena: Secondary | ICD-10-CM | POA: Insufficient documentation

## 2015-12-13 DIAGNOSIS — K635 Polyp of colon: Secondary | ICD-10-CM | POA: Diagnosis not present

## 2015-12-13 DIAGNOSIS — D122 Benign neoplasm of ascending colon: Secondary | ICD-10-CM

## 2015-12-13 DIAGNOSIS — I493 Ventricular premature depolarization: Secondary | ICD-10-CM | POA: Insufficient documentation

## 2015-12-13 DIAGNOSIS — K648 Other hemorrhoids: Secondary | ICD-10-CM | POA: Diagnosis not present

## 2015-12-13 HISTORY — PX: COLONOSCOPY: SHX5424

## 2015-12-13 SURGERY — COLONOSCOPY
Anesthesia: Moderate Sedation

## 2015-12-13 MED ORDER — ONDANSETRON HCL 4 MG/2ML IJ SOLN
INTRAMUSCULAR | Status: DC | PRN
  Administered 2015-12-13: 4 mg via INTRAVENOUS

## 2015-12-13 MED ORDER — ONDANSETRON HCL 4 MG/2ML IJ SOLN
INTRAMUSCULAR | Status: AC
  Filled 2015-12-13: qty 2

## 2015-12-13 MED ORDER — MEPERIDINE HCL 100 MG/ML IJ SOLN
INTRAMUSCULAR | Status: DC
Start: 2015-12-13 — End: 2015-12-13
  Filled 2015-12-13: qty 2

## 2015-12-13 MED ORDER — MEPERIDINE HCL 100 MG/ML IJ SOLN
INTRAMUSCULAR | Status: DC | PRN
  Administered 2015-12-13 (×2): 50 mg via INTRAVENOUS

## 2015-12-13 MED ORDER — SODIUM CHLORIDE 0.9 % IV SOLN
INTRAVENOUS | Status: DC
  Administered 2015-12-13: 1000 mL via INTRAVENOUS

## 2015-12-13 MED ORDER — MIDAZOLAM HCL 5 MG/5ML IJ SOLN
INTRAMUSCULAR | Status: AC
  Filled 2015-12-13: qty 10

## 2015-12-13 MED ORDER — MIDAZOLAM HCL 5 MG/5ML IJ SOLN
INTRAMUSCULAR | Status: DC | PRN
  Administered 2015-12-13: 2 mg via INTRAVENOUS
  Administered 2015-12-13 (×2): 1 mg via INTRAVENOUS
  Administered 2015-12-13: 2 mg via INTRAVENOUS
  Administered 2015-12-13: 1 mg via INTRAVENOUS

## 2015-12-13 MED ORDER — STERILE WATER FOR IRRIGATION IR SOLN
Status: DC | PRN
  Administered 2015-12-13: 14:00:00

## 2015-12-13 NOTE — Op Note (Signed)
[facility] [address] [place], [address]   COLONOSCOPY PROCEDURE REPORT  PATIENT: Warren Barr, Warren Barr  MR#: [id number] BIRTHDATE: [birth year] , 52  yrs. old GENDER: male ENDOSCOPIST: R.  Garfield Cornea, MD FACP [facility] REFERRED [id number]  PROCEDURE DATE:  12/15/2015 PROCEDURE:   Colonoscopy with snare polypectomy INDICATIONS:Self-limiting hematochezia. MEDICATIONS: Versed 7 mg IV and Demerol 100 mg IV in divided doses. Zofran 4 mg IV. ASA CLASS:       Class II  CONSENT: The risks, benefits, alternatives and imponderables including but not limited to bleeding, perforation as well as the possibility of a missed lesion have been reviewed.  The potential for biopsy, lesion removal, etc. have also been discussed. Questions have been answered.  All parties agreeable.  Please see the history and physical in the medical record for more information.  DESCRIPTION OF PROCEDURE:   After the risks benefits and alternatives of the procedure were thoroughly explained, informed consent was obtained.  The digital rectal exam revealed no abnormalities of the rectum.   The [id number]  endoscope was introduced through the anus and advanced to the cecum, which was identified by both the appendix and ileocecal valve. No adverse events experienced.   The quality of the prep was adequate  The instrument was then slowly withdrawn as the colon was fully examined. Estimated blood loss is zero unless otherwise noted in this procedure report.      COLON FINDINGS: Internal hemorrhoids; otherwise, normal-appearing rectal mucosa.  The patient had (1) 1.25 cm oval-shaped polyp in the mid ascending segment and an adjacent 5 mm polyp; otherwise, the remainder of the colonic mucosa appeared normal.   The above-mentioned polyps were hot and cold snare removed, respectively.  Retroflexion was performed. .  Withdrawal time=10 minutes 0 seconds.  The scope was withdrawn and the  procedure completed. COMPLICATIONS: There were no immediate complications. EBL 1 mL ENDOSCOPIC IMPRESSION: Multiple colonic polyps?"removed as described above. Internal hemorrhoids. I suspect either the patient had an acute fissure or expressed a hemorrhoidal bleed recently. At any rate, he is now asymptomatic from a GI standpoint.  RECOMMENDATIONS: Follow up on pathology. Further recommendations to follow.  eSigned:  R. Garfield Cornea, MD Rosalita Chessman [facility] December 15, 2015 2:50 PM   cc:  CPT CODES: ICD CODES:  The ICD and CPT codes recommended by this software are interpretations from the data that the clinical staff has captured with the software.  The verification of the translation of this report to the ICD and CPT codes and modifiers is the sole responsibility of the health care institution and practicing physician where this report was generated.  [employer] will not be held responsible for the validity of the ICD and CPT codes included on this report.  AMA assumes no liability for data contained or not contained herein. CPT is a Designer, television/film set of the [employer].  PATIENT NAME:  Warren Barr, Warren Barr MR#: [id number]

## 2015-12-13 NOTE — Interval H&P Note (Signed)
   History and Physical Interval Note:  12/13/2015 2:18 PM  Warren Barr  has presented today for surgery, with the diagnosis of RECTAL PAIN  The various methods of treatment have been discussed with the patient and family. After consideration of risks, benefits and other options for treatment, the patient has consented to  Procedure(s) with comments: COLONOSCOPY (N/A) - 245 - moved to 1:45 - office to notify as a surgical intervention .  The patient's history has been reviewed, patient examined, no change in status, stable for surgery.  I have reviewed the patient's chart and labs.  Questions were answered to the patient's satisfaction.     Warren Barr  Lower GI symptoms have totally resolved. Diagnostic colonoscopy today per plan.  The risks, benefits, limitations, alternatives and imponderables have been reviewed with the patient. Questions have been answered. All parties are agreeable.

## 2015-12-13 NOTE — Discharge Instructions (Signed)
Colonoscopy Discharge Instructions  Read the instructions outlined below and refer to this sheet in the next few weeks. These discharge instructions provide you with general information on caring for yourself after you leave the hospital. Your doctor may also give you specific instructions. While your treatment has been planned according to the most current medical practices available, unavoidable complications occasionally occur. If you have any problems or questions after discharge, call Dr. Gala Romney at [phone]. ACTIVITY  You may resume your regular activity, but move at a slower pace for the next 24 hours.   Take frequent rest periods for the next 24 hours.   Walking will help get rid of the air and reduce the bloated feeling in your belly (abdomen).   No driving for 24 hours (because of the medicine (anesthesia) used during the test).    Do not sign any important legal documents or operate any machinery for 24 hours (because of the anesthesia used during the test).  NUTRITION  Drink plenty of fluids.   You may resume your normal diet as instructed by your doctor.   Begin with a light meal and progress to your normal diet. Heavy or fried foods are harder to digest and may make you feel sick to your stomach (nauseated).   Avoid alcoholic beverages for 24 hours or as instructed.  MEDICATIONS  You may resume your normal medications unless your doctor tells you otherwise.  WHAT YOU CAN EXPECT TODAY  Some feelings of bloating in the abdomen.   Passage of more gas than usual.   Spotting of blood in your stool or on the toilet paper.   FINDING OUT THE RESULTS OF YOUR TEST Not all test results are available during your visit. If your test results are not back during the visit, make an appointment with your caregiver to find out the results. Do not assume everything is normal if you have not heard from your caregiver or the medical facility. It is important for you to follow up on all  of your test results.  SEEK IMMEDIATE MEDICAL ATTENTION IF:  You have more than a spotting of blood in your stool.   Your belly is swollen (abdominal distention).   You are nauseated or vomiting.   You have a temperature over 101.   You have abdominal pain or discomfort that is severe or gets worse throughout the day.   Colon polyp and hemorrhoid information provided  Further recommendations to follow pending review of pathology report  Colon Polyps Polyps are lumps of extra tissue growing inside the body. Polyps can grow in the large intestine (colon). Most colon polyps are noncancerous (benign). However, some colon polyps can become cancerous over time. Polyps that are larger than a pea may be harmful. To be safe, caregivers remove and test all polyps. CAUSES  Polyps form when mutations in the genes cause your cells to grow and divide even though no more tissue is needed. RISK FACTORS There are a number of risk factors that can increase your chances of getting colon polyps. They include:  Being older than 50 years.  Family history of colon polyps or colon cancer.  Long-term colon diseases, such as colitis or Crohn disease.  Being overweight.  Smoking.  Being inactive.  Drinking too much alcohol. SYMPTOMS  Most small polyps do not cause symptoms. If symptoms are present, they may include:  Blood in the stool. The stool may look dark red or black.  Constipation or diarrhea that lasts longer than 1  week. DIAGNOSIS People often do not know they have polyps until their caregiver finds them during a regular checkup. Your caregiver can use 4 tests to check for polyps:  Digital rectal exam. The caregiver wears gloves and feels inside the rectum. This test would find polyps only in the rectum.  Barium enema. The caregiver puts a liquid called barium into your rectum before taking X-rays of your colon. Barium makes your colon look white. Polyps are dark, so they are easy to  see in the X-ray pictures.  Sigmoidoscopy. A thin, flexible tube (sigmoidoscope) is placed into your rectum. The sigmoidoscope has a light and tiny camera in it. The caregiver uses the sigmoidoscope to look at the last third of your colon.  Colonoscopy. This test is like sigmoidoscopy, but the caregiver looks at the entire colon. This is the most common method for finding and removing polyps. TREATMENT  Any polyps will be removed during a sigmoidoscopy or colonoscopy. The polyps are then tested for cancer. PREVENTION  To help lower your risk of getting more colon polyps:  Eat plenty of fruits and vegetables. Avoid eating fatty foods.  Do not smoke.  Avoid drinking alcohol.  Exercise every day.  Lose weight if recommended by your caregiver.  Eat plenty of calcium and folate. Foods that are rich in calcium include milk, cheese, and broccoli. Foods that are rich in folate include chickpeas, kidney beans, and spinach. HOME CARE INSTRUCTIONS Keep all follow-up appointments as directed by your caregiver. You may need periodic exams to check for polyps. SEEK MEDICAL CARE IF: You notice bleeding during a bowel movement.   This information is not intended to replace advice given to you by your health care provider. Make sure you discuss any questions you have with your health care provider.   Document Released: 08/09/2004 Document Revised: 12/04/2014 Document Reviewed: 01/23/2012 Elsevier Interactive Patient Education 2016 [employer].  Hemorrhoids Hemorrhoids are swollen veins around the rectum or anus. There are two types of hemorrhoids:   Internal hemorrhoids. These occur in the veins just inside the rectum. They may poke through to the outside and become irritated and painful.  External hemorrhoids. These occur in the veins outside the anus and can be felt as a painful swelling or hard lump near the anus. CAUSES  Pregnancy.   Obesity.   Constipation or diarrhea.    Straining to have a bowel movement.   Sitting for long periods on the toilet.  Heavy lifting or other activity that caused you to strain.  Anal intercourse. SYMPTOMS   Pain.   Anal itching or irritation.   Rectal bleeding.   Fecal leakage.   Anal swelling.   One or more lumps around the anus.  DIAGNOSIS  Your caregiver may be able to diagnose hemorrhoids by visual examination. Other examinations or tests that may be performed include:   Examination of the rectal area with a gloved hand (digital rectal exam).   Examination of anal canal using a small tube (scope).   A blood test if you have lost a significant amount of blood.  A test to look inside the colon (sigmoidoscopy or colonoscopy). TREATMENT Most hemorrhoids can be treated at home. However, if symptoms do not seem to be getting better or if you have a lot of rectal bleeding, your caregiver may perform a procedure to help make the hemorrhoids get smaller or remove them completely. Possible treatments include:   Placing a rubber band at the base of the hemorrhoid to cut  off the circulation (rubber band ligation).   Injecting a chemical to shrink the hemorrhoid (sclerotherapy).   Using a tool to burn the hemorrhoid (infrared light therapy).   Surgically removing the hemorrhoid (hemorrhoidectomy).   Stapling the hemorrhoid to block blood flow to the tissue (hemorrhoid stapling).  HOME CARE INSTRUCTIONS   Eat foods with fiber, such as whole grains, beans, nuts, fruits, and vegetables. Ask your doctor about taking products with added fiber in them (fibersupplements).  Increase fluid intake. Drink enough water and fluids to keep your urine clear or pale yellow.   Exercise regularly.   Go to the bathroom when you have the urge to have a bowel movement. Do not wait.   Avoid straining to have bowel movements.   Keep the anal area dry and clean. Use wet toilet paper or moist towelettes after a  bowel movement.   Medicated creams and suppositories may be used or applied as directed.   Only take over-the-counter or prescription medicines as directed by your caregiver.   Take warm sitz baths for 15-20 minutes, 3-4 times a day to ease pain and discomfort.   Place ice packs on the hemorrhoids if they are tender and swollen. Using ice packs between sitz baths may be helpful.   Put ice in a plastic bag.   Place a towel between your skin and the bag.   Leave the ice on for 15-20 minutes, 3-4 times a day.   Do not use a donut-shaped pillow or sit on the toilet for long periods. This increases blood pooling and pain.  SEEK MEDICAL CARE IF:  You have increasing pain and swelling that is not controlled by treatment or medicine.  You have uncontrolled bleeding.  You have difficulty or you are unable to have a bowel movement.  You have pain or inflammation outside the area of the hemorrhoids. MAKE SURE YOU:  Understand these instructions.  Will watch your condition.  Will get help right away if you are not doing well or get worse.   This information is not intended to replace advice given to you by your health care provider. Make sure you discuss any questions you have with your health care provider.   Document Released: 11/10/2000 Document Revised: 10/30/2012 Document Reviewed: 09/17/2012 Elsevier Interactive Patient Education [employer].

## 2015-12-13 NOTE — Telephone Encounter (Signed)
f °

## 2015-12-13 NOTE — H&P (View-Only) (Signed)
Primary Care Physician:  No PCP Per Patient Primary Gastroenterologist:  Dr. Gala Romney  Chief Complaint  Patient presents with  . Rectal Pain  . set up TCS    HPI:   50 year old male presents for chief complaint of rectal pain and possible need for colonoscopy. Records provided with his chart are notes from [facility] with no indication of rectal pain or need for colonoscopy). There was a CT stone protocol from 12/21/2014 which found hepatobiliary unremarkable stomach and bowel unremarkable, impression of no findings to explain patient's symptoms. While taking through the electronic medical record it was noted he was seen by our practice in 2009 for toilet tissue hematochezia and has subsequent colonoscopy which found a single hemorrhoid tag, otherwise normal rectum. Polyp mid descending colon status post removal with the remainder of the colonic and terminal mucosa normal. He was given ten-day Anusol suppository course. Surgical pathology found hyperplastic polyp.  Today he states his rectal pain started about a year ago. Saw urgent care due to inability to urinate. Followed up with urology who has been seeing him. Has worsening perineal pain and rectal when sitting on hard surfaces. Urology diagnosed him with pelvic floor pain, completed physical therapy with significant improvement. Is also having toilet tissue hematochezia which also began again in the past year. Denies melena, abdominal pain, N/V, fever, chills, unintentional weight loss. Denies chest pain, dyspnea, dizziness, lightheadedness, syncope, near syncope. Denies any other upper or lower GI symptoms.  Past Medical History  Diagnosis Date  . Chest tightness   . Dizziness   . Anxiety states   . PVC (premature ventricular contraction)     Past Surgical History  Procedure Laterality Date  . Plantar fasciitis      Bilateral    Current Outpatient Prescriptions  Medication Sig Dispense Refill  . ALPRAZolam (XANAX) 0.25  MG tablet Take one every 6 hours as needed for anxiety 30 tablet 3  . diltiazem (CARDIZEM CD) 120 MG 24 hr capsule Take 1 capsule (120 mg total) by mouth daily. 30 capsule 11   No current facility-administered medications for this visit.    Allergies as of 11/18/2015  . (No Known Allergies)    Family History  Problem Relation Age of Onset  . Heart failure Father     Social History   Social History  . Marital Status: Married    Spouse Name: N/A  . Number of Children: 2  . Years of Education: N/A   Occupational History  . Chief Technology Officer   Social History Main Topics  . Smoking status: Never Smoker   . Smokeless tobacco: Not on file  . Alcohol Use: No  . Drug Use: No  . Sexual Activity: Not on file   Other Topics Concern  . Not on file   Social History Narrative    Review of Systems: General: Negative for anorexia, weight loss, fever, chills, fatigue, weakness. Eyes: Negative for vision changes.  ENT: Negative for hoarseness, difficulty swallowing. CV: Negative for chest pain, angina, palpitations, peripheral edema.  Respiratory: Negative for dyspnea at rest, cough, sputum, wheezing.  GI: See history of present illness. Derm: Negative for rash or itching.  Endo: Negative for unusual weight change.  Heme: Negative for bruising or bleeding. Allergy: Negative for rash or hives.    Physical Exam: BP 126/82 mmHg  Pulse 87  Temp(Src) 97.3 F (36.3 C)  Ht 6\' 2"  (1.88 m)  Wt 210 lb 9.6 oz (95.528 kg)  BMI 27.03 kg/m2 General:   Alert and oriented. Pleasant and cooperative. Well-nourished and well-developed.  Head:  Normocephalic and atraumatic. Eyes:  Without icterus, sclera clear and conjunctiva pink.  Ears:  Normal auditory acuity. Cardiovascular:  S1, S2 present without murmurs appreciated. Extremities without clubbing or edema. Respiratory:  Clear to auscultation bilaterally. No wheezes, rales, or rhonchi. No distress.  Gastrointestinal:  +BS,  soft, non-tender and non-distended. No HSM noted. No guarding or rebound. No masses appreciated.  Rectal:  Deferred  Skin:  Intact without significant lesions or rashes. Neurologic:  Alert and oriented x4;  grossly normal neurologically. Psych:  Alert and cooperative. Normal mood and affect. Heme/Lymph/Immune: No excessive bruising noted.    11/18/2015 10:57 AM

## 2015-12-15 ENCOUNTER — Encounter: Payer: Self-pay | Admitting: Internal Medicine

## 2015-12-15 ENCOUNTER — Encounter (HOSPITAL_COMMUNITY): Payer: Self-pay | Admitting: Internal Medicine

## 2015-12-29 ENCOUNTER — Telehealth: Payer: Self-pay | Admitting: Internal Medicine

## 2015-12-29 NOTE — Telephone Encounter (Signed)
PATIENT CALLED AND STATED HIS TCS WAS NOT FULLY PAID FOR SO GAVE HIM THE NUMBER TO BILLING.

## 2015-12-29 NOTE — Telephone Encounter (Signed)
noted 

## 2016-05-22 ENCOUNTER — Other Ambulatory Visit: Payer: Self-pay | Admitting: Cardiovascular Disease

## 2016-08-11 ENCOUNTER — Ambulatory Visit: Payer: BC Managed Care – PPO | Admitting: Cardiovascular Disease

## 2016-08-11 ENCOUNTER — Ambulatory Visit (INDEPENDENT_AMBULATORY_CARE_PROVIDER_SITE_OTHER): Payer: BC Managed Care – PPO | Admitting: Cardiovascular Disease

## 2016-08-11 ENCOUNTER — Encounter: Payer: Self-pay | Admitting: Cardiovascular Disease

## 2016-08-11 VITALS — BP 130/88 | HR 80 | Ht 74.0 in | Wt 215.4 lb

## 2016-08-11 DIAGNOSIS — F419 Anxiety disorder, unspecified: Secondary | ICD-10-CM

## 2016-08-11 DIAGNOSIS — I493 Ventricular premature depolarization: Secondary | ICD-10-CM | POA: Diagnosis not present

## 2016-08-11 MED ORDER — DILTIAZEM HCL ER COATED BEADS 120 MG PO CP24: 120.0000 mg | ORAL_CAPSULE | Freq: Every day | ORAL | 11 refills | Status: DC

## 2016-08-11 MED ORDER — ALPRAZOLAM 0.25 MG PO TABS: 0.2500 mg | ORAL_TABLET | Freq: Three times a day (TID) | ORAL | 3 refills | Status: DC | PRN

## 2016-08-11 NOTE — Patient Instructions (Signed)

## 2016-08-11 NOTE — Progress Notes (Signed)
Chief Complaint  Patient presents with  . PVC's    follow up     History of Present Illness: 50 yo male with history of chest pain, palpitations secondary to PVCs/PACs who is here today for cardiac follow up. I saw him as a new patient 02/26/12 for evaluation of chest pain and palpitations. I arranged an echo which showed normal LV systolic function with LVEF of 60%. No valvular disease. His 48 hour Holter monitor showed NSR with 23,000 PVCs and 240 PACs.   He is here today for follow up. He is feeling great. No chest pain or SOB. He has rare palpitations. No near syncope or syncope.   Primary Care Physician: No PCP Per Patient  Past Medical History:  Diagnosis Date  . Anxiety states   . Dizziness   . PVC (premature ventricular contraction)     Past Surgical History:  Procedure Laterality Date  . COLONOSCOPY N/A 12/13/2015   Procedure: COLONOSCOPY;  Surgeon: Daneil Dolin, MD;  [facility];  Service: Endoscopy;  Laterality: N/A;  245 - moved to 1:45 - office to notify  . Plantar fasciitis     Bilateral    Current Outpatient Prescriptions  Medication Sig Dispense Refill  . ALPRAZolam (XANAX) 0.25 MG tablet Take 1 tablet (0.25 mg total) by mouth 3 (three) times daily as needed (anxiety). 30 tablet 3  . diltiazem (CARDIZEM CD) 120 MG 24 hr capsule Take 1 capsule (120 mg total) by mouth daily. 30 capsule 11   No current facility-administered medications for this visit.     No Known Allergies  Social History   Social History  . Marital status: Married    Spouse name: N/A  . Number of children: 2  . Years of education: N/A   Occupational History  . Teacher [employer]   Social History Main Topics  . Smoking status: Never Smoker  . Smokeless tobacco: Never Used  . Alcohol use 0.0 oz/week     Comment: Drinks 1 glass of wine 2-3 nights a week.  . Drug use: No  . Sexual activity: Not on file   Other Topics Concern  .  Not on file   Social History Narrative  . No narrative on file    Family History  Problem Relation Age of Onset  . Heart failure Father   . Colon cancer Neg Hx     Review of Systems:  As stated in the HPI and otherwise negative.   BP 130/88   Pulse 80   Ht 6\' 2"  (1.88 m)   Wt 215 lb 6.4 oz (97.7 kg)   BMI 27.66 kg/m   Physical Examination: General: Well developed, well nourished, NAD  HEENT: OP clear, mucus membranes moist  SKIN: warm, dry. No rashes. Neuro: No focal deficits  Musculoskeletal: Muscle strength 5/5 all ext  Psychiatric: Mood and affect normal  Neck: No JVD, no carotid bruits, no thyromegaly, no lymphadenopathy.  Lungs:Clear bilaterally, no wheezes, rhonci, crackles Cardiovascular: Regular rate and rhythm. No murmurs, gallops or rubs. Abdomen:Soft. Bowel sounds present. Non-tender.  Extremities: No lower extremity edema. Pulses are 2 + in the bilateral DP/PT.  EKG:  EKG is ordered today. The ekg ordered today demonstrates NSR, rate 80 bpm.   Recent Labs: No results found for requested labs within last 8760 hours.   Lipid Panel No results found for: CHOL, TRIG, HDL, CHOLHDL, VLDL, LDLCALC, LDLDIRECT   Wt Readings from Last  3 Encounters:  08/11/16 215 lb 6.4 oz (97.7 kg)  12/13/15 210 lb (95.3 kg)  11/18/15 210 lb 9.6 oz (95.5 kg)     Other studies Reviewed: Additional studies/ records that were reviewed today include: . Review of the above records demonstrates:    Assessment and Plan:   1. Palpitations: known to have PVCs/PACs. No recent palpitations. Continue Cardizem CD 120 mg po Qdaily. He uses Xanax prn for anxiety.   2. Anxiety: Will continue Xanax prn.  Current medicines are reviewed at length with the patient today.  The patient does not have concerns regarding medicines.  The following changes have been made:  no change  Labs/ tests ordered today include:   Orders Placed This Encounter  Procedures  . EKG 12-Lead     Disposition:   FU with me in 12  months  Signed, Lauree Chandler, MD 08/11/2016 5:04 PM    [facility] [place], [place], [place]  [address] Phone: [phone]; Fax: [phone]

## 2017-04-02 ENCOUNTER — Emergency Department (HOSPITAL_COMMUNITY)
Admission: EM | Admit: 2017-04-02 | Discharge: 2017-04-02 | Disposition: A | Discharge status: Home / Self Care | Payer: BC Managed Care – PPO | Attending: Emergency Medicine | Admitting: Emergency Medicine

## 2017-04-02 ENCOUNTER — Encounter (HOSPITAL_COMMUNITY): Payer: Self-pay | Admitting: *Deleted

## 2017-04-02 DIAGNOSIS — M545 Low back pain, unspecified: Secondary | ICD-10-CM

## 2017-04-02 DIAGNOSIS — Z79899 Other long term (current) drug therapy: Secondary | ICD-10-CM | POA: Diagnosis not present

## 2017-04-02 MED ORDER — METHOCARBAMOL 500 MG PO TABS: 500.0000 mg | ORAL_TABLET | Freq: Two times a day (BID) | ORAL | 0 refills | Status: DC | PRN

## 2017-04-02 MED ORDER — KETOROLAC TROMETHAMINE 60 MG/2ML IM SOLN
60.0000 mg | Freq: Once | INTRAMUSCULAR | Status: AC
  Administered 2017-04-02: 60 mg via INTRAMUSCULAR
  Filled 2017-04-02: qty 2

## 2017-04-02 MED ORDER — OXYCODONE-ACETAMINOPHEN 5-325 MG PO TABS
2.0000 | ORAL_TABLET | Freq: Once | ORAL | Status: AC
  Administered 2017-04-02: 2 via ORAL
  Filled 2017-04-02: qty 2

## 2017-04-02 MED ORDER — DEXAMETHASONE SODIUM PHOSPHATE 10 MG/ML IJ SOLN
10.0000 mg | Freq: Once | INTRAMUSCULAR | Status: AC
  Administered 2017-04-02: 10 mg via INTRAMUSCULAR
  Filled 2017-04-02: qty 1

## 2017-04-02 MED ORDER — IBUPROFEN 800 MG PO TABS: 800.0000 mg | ORAL_TABLET | Freq: Three times a day (TID) | ORAL | 0 refills | Status: DC

## 2017-04-02 MED ORDER — HYDROCODONE-ACETAMINOPHEN 5-325 MG PO TABS: 2.0000 | ORAL_TABLET | ORAL | 0 refills | Status: DC | PRN

## 2017-04-02 NOTE — ED Triage Notes (Signed)
Pt brought in by [facility] c/o lower back pain that started Friday after he slipped while outside while doing yard work. Pt has used old prescription of Hydrocodone at home at 0700 today with no relief. Pt denies numbness or tingling in legs. Pt has been non ambulatory since the injury occurred on Friday.

## 2017-04-02 NOTE — Discharge Instructions (Signed)
Motrin / Advil / Ibuprofen 3 times daily Hydrocodone for severe pain Robaxin as prescribed Rest for 24 hours, then gradually increase your movement and activity No heavy lifting or bending for next week  Please obtain all of your results from medical records or have your doctors office obtain the results - share them with your doctor - you should be seen at your doctors office in the next 2 days. Call today to arrange your follow up. Take the medications as prescribed. Please review all of the medicines and only take them if you do not have an allergy to them. Please be aware that if you are taking birth control pills, taking other prescriptions, ESPECIALLY ANTIBIOTICS may make the birth control ineffective - if this is the case, either do not engage in sexual activity or use alternative methods of birth control such as condoms until you have finished the medicine and your family doctor says it is OK to restart them. If you are on a blood thinner such as COUMADIN, be aware that any other medicine that you take may cause the coumadin to either work too much, or not enough - you should have your coumadin level rechecked in next 7 days if this is the case.  ?  It is also a possibility that you have an allergic reaction to any of the medicines that you have been prescribed - Everybody reacts differently to medications and while MOST people have no trouble with most medicines, you may have a reaction such as nausea, vomiting, rash, swelling, shortness of breath. If this is the case, please stop taking the medicine immediately and contact your physician.  ?  You should return to the ER if you develop severe or worsening symptoms.

## 2017-04-02 NOTE — ED Provider Notes (Signed)
[facility] DEPT Provider Note   CSN: [id number] Arrival date & time: 04/02/17  1204     History   Chief Complaint Chief Complaint  Patient presents with  . Back Pain    HPI Warren Barr is a 51 y.o. male.  HPI  The patient is a 51 year old male, he denies any significant past medical history but states that when he was doing some weed eating several days ago he developed some discomfort in his lower back, this was mild, then 2 days ago he bent over to tie his shoes and felt acute onset of pain especially in the left lower back. This is severe with any range of motion. He has been staying in bed because of any significant movement causes back pain. He has been using over-the-counter medications as well as some heating and icing pads, no significant improvement. Denies fevers, cancer, IV drug use, weakness, numbness or difficulty urinating other than getting back and forth to the bathroom. He does have a history of some muscular lower back pain in the past but this seems to be worse than the other episodes. Any type of movement makes it significantly worse, holding still makes it better but it is still hurting at baseline.  Past Medical History:  Diagnosis Date  . Anxiety states   . Dizziness   . PVC (premature ventricular contraction)     Patient Active Problem List   Diagnosis Date Noted  . History of colonic polyps   . Rectal pain 11/18/2015  . Hematochezia 11/18/2015  . PVC (premature ventricular contraction) 03/28/2012  . Palpitations 02/26/2012  . Anxiety states     Past Surgical History:  Procedure Laterality Date  . COLONOSCOPY N/A 12/13/2015   Procedure: COLONOSCOPY;  Surgeon: Daneil Dolin, MD;  [facility];  Service: Endoscopy;  Laterality: N/A;  245 - moved to 1:45 - office to notify  . Plantar fasciitis     Bilateral  . VASECTOMY         Home Medications    Prior to Admission medications   Medication Sig Start Date End Date Taking?  Authorizing Provider  ALPRAZolam (XANAX) 0.25 MG tablet Take 1 tablet (0.25 mg total) by mouth 3 (three) times daily as needed (anxiety). 08/11/16   Burnell Blanks, MD  diltiazem (CARDIZEM CD) 120 MG 24 hr capsule Take 1 capsule (120 mg total) by mouth daily. 08/11/16   Burnell Blanks, MD  HYDROcodone-acetaminophen (NORCO/VICODIN) 5-325 MG tablet Take 2 tablets by mouth every 4 (four) hours as needed. 04/02/17   Noemi Chapel, MD  ibuprofen (ADVIL,MOTRIN) 800 MG tablet Take 1 tablet (800 mg total) by mouth 3 (three) times daily. 04/02/17   Noemi Chapel, MD  methocarbamol (ROBAXIN) 500 MG tablet Take 1 tablet (500 mg total) by mouth 2 (two) times daily as needed for muscle spasms. 04/02/17   Noemi Chapel, MD    Family History Family History  Problem Relation Age of Onset  . Heart failure Father   . Colon cancer Neg Hx     Social History Social History  Substance Use Topics  . Smoking status: Never Smoker  . Smokeless tobacco: Never Used  . Alcohol use 0.0 oz/week     Comment: Drinks 1 glass of wine 2-3 nights a week.     Allergies   Patient has no known allergies.   Review of Systems Review of Systems  Constitutional: Negative for fever.  Musculoskeletal: Positive for back pain.  Neurological: Negative for weakness  and numbness.     Physical Exam Updated Vital Signs BP (!) 144/98   Pulse 81   Temp 98.5 F (36.9 C) (Oral)   Resp 18   Ht 6\' 2"  (1.88 m)   Wt 220 lb (99.8 kg)   SpO2 96%   BMI 28.25 kg/m   Physical Exam  Constitutional: He appears well-developed and well-nourished. No distress.  HENT:  Head: Normocephalic and atraumatic.  Eyes: Conjunctivae are normal. Right eye exhibits no discharge. Left eye exhibits no discharge. No scleral icterus.  Cardiovascular: Normal rate and regular rhythm.   Pulmonary/Chest: Effort normal and breath sounds normal.  Musculoskeletal: He exhibits tenderness. He exhibits no edema.  Tenderness of the back over the  lumbar and paraspinal muscles of the lumbar spine especially on the left No tenderness over the Cervical, Thoracic spine  Neurological: He is alert.  Speech is clear, strength in the UE and LE's are normal at the major muscle groups including the hip, knee and ankles.  Sensation in tact to light touch and pin prick of the bilateral LE's.  Normal reflexes at the knees bilaterally.  Gait antalgic secondary to pain   Skin: Skin is warm and dry. No rash noted. He is not diaphoretic.     ED Treatments / Results  Labs (all labs ordered are listed, but only abnormal results are displayed) Labs Reviewed - No data to display   Radiology No results found.  Procedures Procedures (including critical care time)  Medications Ordered in ED Medications  dexamethasone (DECADRON) injection 10 mg (10 mg Intramuscular Given 04/02/17 1242)  oxyCODONE-acetaminophen (PERCOCET/ROXICET) 5-325 MG per tablet 2 tablet (2 tablets Oral Given 04/02/17 1243)  ketorolac (TORADOL) injection 60 mg (60 mg Intramuscular Given 04/02/17 1242)     Initial Impression / Assessment and Plan / ED Course  I have reviewed the triage vital signs and the nursing notes.  Pertinent labs & imaging results that were available during my care of the patient were reviewed by me and considered in my medical decision making (see chart for details).     Likely muscular back pain, could be spasm, medications given as above, should anticipate discharge with close follow-up outpatient  Final Clinical Impressions(s) / ED Diagnoses   Final diagnoses:  Acute bilateral low back pain without sciatica    New Prescriptions New Prescriptions   HYDROCODONE-ACETAMINOPHEN (NORCO/VICODIN) 5-325 MG TABLET    Take 2 tablets by mouth every 4 (four) hours as needed.   IBUPROFEN (ADVIL,MOTRIN) 800 MG TABLET    Take 1 tablet (800 mg total) by mouth 3 (three) times daily.   METHOCARBAMOL (ROBAXIN) 500 MG TABLET    Take 1 tablet (500 mg total) by mouth 2  (two) times daily as needed for muscle spasms.     Noemi Chapel, MD 04/02/17 [phone]

## 2017-10-26 ENCOUNTER — Encounter: Payer: Self-pay | Admitting: Cardiology

## 2017-10-26 ENCOUNTER — Ambulatory Visit: Payer: BC Managed Care – PPO | Admitting: Cardiology

## 2017-10-26 VITALS — BP 108/72 | HR 85 | Resp 16 | Ht 74.0 in | Wt 224.1 lb

## 2017-10-26 DIAGNOSIS — R002 Palpitations: Secondary | ICD-10-CM

## 2017-10-26 DIAGNOSIS — I493 Ventricular premature depolarization: Secondary | ICD-10-CM | POA: Diagnosis not present

## 2017-10-26 MED ORDER — DILTIAZEM HCL ER COATED BEADS 120 MG PO CP24: 120.0000 mg | ORAL_CAPSULE | Freq: Every day | ORAL | 3 refills | Status: DC

## 2017-10-26 MED ORDER — ALPRAZOLAM 0.25 MG PO TABS: 0.2500 mg | ORAL_TABLET | Freq: Three times a day (TID) | ORAL | 0 refills | Status: DC | PRN

## 2017-10-26 NOTE — Progress Notes (Signed)
10/26/2017 Warren Barr   [birth year]  [id number]  Primary Physician Patient, No Pcp Per Primary Cardiologist: Dr. Angelena Form   Reason for Visit/CC: 1 year f/u for PACs/ PVCs  HPI:  Warren Barr is a 51 y.o. male who is being seen today for routine yearly f/u. He is followed by Dr. Angelena Form. He was referred to him 02/26/12 for evaluation of chest pain and palpitations. Dr. Angelena Form arranged an echo which showed normal LV systolic function with LVEF of 60%. No valvular disease. His 48 hour Holter monitor showed NSR with 23,000 PVCs and 240 PACs. He was started on Cardizem, which resulted in symptomatic improvement. He has no h/o CAD. He has h/o anxiety and takes PRN xanax.   He presents today for f/u. States he has been doing well. No cardiac symptoms. Denies CP and dyspnea. Gets plenty of exercise at work. 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 He notes he also eats a balanced diet. Denies tobacco use. He does not have an established PCP. Has not had cholesterol nor DM screening in over 2 years.   EKG today shows NSR. HR 85 bpm. BP is well controlled at 108/72.   Current Meds  Medication Sig  . ALPRAZolam (XANAX) 0.25 MG tablet Take 1 tablet (0.25 mg total) by mouth 3 (three) times daily as needed (anxiety).  Marland Kitchen diltiazem (CARDIZEM CD) 120 MG 24 hr capsule Take 1 capsule (120 mg total) by mouth daily.  Marland Kitchen HYDROcodone-acetaminophen (NORCO/VICODIN) 5-325 MG tablet Take 2 tablets by mouth every 4 (four) hours as needed.  Marland Kitchen ibuprofen (ADVIL,MOTRIN) 800 MG tablet Take 1 tablet (800 mg total) by mouth 3 (three) times daily.  . methocarbamol (ROBAXIN) 500 MG tablet Take 1 tablet (500 mg total) by mouth 2 (two) times daily as needed for muscle spasms.   No Known Allergies Past Medical History:  Diagnosis Date  . Anxiety states   . Dizziness   . PVC (premature ventricular contraction)    Family History  Problem Relation Age of Onset  . Heart failure Father   . Colon cancer Neg Hx    Past  Surgical History:  Procedure Laterality Date  . COLONOSCOPY N/A 12/13/2015   Procedure: COLONOSCOPY;  Surgeon: Daneil Dolin, MD;  [facility];  Service: Endoscopy;  Laterality: N/A;  245 - moved to 1:45 - office to notify  . Plantar fasciitis     Bilateral  . VASECTOMY     Social History   Socioeconomic History  . Marital status: Married    Spouse name: Not on file  . Number of children: 2  . Years of education: Not on file  . Highest education level: Not on file  Social Needs  . Financial resource strain: Not on file  . Food insecurity - worry: Not on file  . Food insecurity - inability: Not on file  . Transportation needs - medical: Not on file  . Transportation needs - non-medical: Not on file  Occupational History  . 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 Smoking status: Never Smoker  . Smokeless tobacco: Never Used  Substance and Sexual Activity  . Alcohol use: Yes    Alcohol/week: 0.0 oz    Comment: Drinks 1 glass of wine 2-3 nights a week.  . Drug use: No  . Sexual activity: Not on file  Other Topics Concern  . Not on file  Social History Narrative  .  Not on file     Review of Systems: General: negative for chills, fever, night sweats or weight changes.  Cardiovascular: negative for chest pain, dyspnea on exertion, edema, orthopnea, palpitations, paroxysmal nocturnal dyspnea or shortness of breath Dermatological: negative for rash Respiratory: negative for cough or wheezing Urologic: negative for hematuria Abdominal: negative for nausea, vomiting, diarrhea, bright red blood per rectum, melena, or hematemesis Neurologic: negative for visual changes, syncope, or dizziness All other systems reviewed and are otherwise negative except as noted above.   Physical Exam:  Blood pressure 108/72, pulse 85, resp. rate 16, height 6\' 2"  (1.88 m), weight 224 lb 1.9 oz (101.7 kg), SpO2 97 %.    General appearance: alert, cooperative and no distress Neck: no carotid bruit and no JVD Lungs: clear to auscultation bilaterally Heart: regular rate and rhythm, S1, S2 normal, no murmur, click, rub or gallop Extremities: extremities normal, atraumatic, no cyanosis or edema Pulses: 2+ and symmetric Skin: Skin color, texture, turgor normal. No rashes or lesions Neurologic: Grossly normal  EKG NSR 85 bpm -- personally reviewed   ASSESSMENT AND PLAN:   1. PVCs: controlled with Cardizem. Asymptomatic. HR and BP well controlled. Continue Cardizem. Rx refilled, 90 day supply 3 refills.   2. Other Screening: no h/o CAD. Pt has no ischemic symptoms. Denies CP and dyspnea. No indication for stress testing at this time. He also has no established PCP and has not had cholesterol nor DM screening in over 2 years. We discussed this. He plans to establish care with [facility] next year. He will also turn 51 y/o in 2019 and he was advised to get colonoscopy.   3. Anxiety: Pt requested refill of Xanax, which historically has been filled by Dr. Angelena Form given he does not have a PCP. I gave him a 1 x Rx with no refills. He will need to call in for request for additional refills from Dr. Angelena Form or from new PCP.    Follow-Up in 1 year   Brittainy Ladoris Gene, MHS [facility] 10/26/2017 3:32 PM

## 2017-10-26 NOTE — Patient Instructions (Signed)
Medication Instructions:  YOU HAVE BEEN GIVEN A REFILL FOR XANAX 0.25 MG THREE TIMES DAILY AS NEEDED   A REFILL HAS BEEN SENT IN FOR CARDIZEM  Labwork: NONE ORDERED TODAY  Testing/Procedures: NONE ORDERED TODAY  Follow-Up: Your physician wants you to follow-up in: 1 YEAR WITH DR. Angelena Form  You will receive a reminder letter in the mail two months in advance. If you don't receive a letter, please call our office to schedule the follow-up appointment.   Any Other Special Instructions Will Be Listed Below (If Applicable).     If you need a refill on your cardiac medications before your next appointment, please call your pharmacy.

## 2018-05-29 ENCOUNTER — Encounter: Payer: Self-pay | Admitting: Internal Medicine

## 2018-07-16 ENCOUNTER — Telehealth: Payer: Self-pay

## 2018-07-16 NOTE — Telephone Encounter (Signed)
Pt called to schedule his nurse visit for TCS. [phone].

## 2018-07-17 NOTE — Telephone Encounter (Signed)
PATIENT NOT DUE FOR TCS UNTIL 11/2018

## 2018-08-20 ENCOUNTER — Telehealth: Payer: Self-pay | Admitting: Internal Medicine

## 2018-08-20 NOTE — Telephone Encounter (Signed)
Warren Barr please make contact with the patient

## 2018-08-20 NOTE — Telephone Encounter (Signed)
Spoke with pt. He has Reflux and it's been worsening x 2 weeks. Pts PCP put pt on Pantoprazole 40 mg daily. Pt feels his symptoms are worse at night and he isn't able to eat dinner due to not being able to swallow. If he attempts to eat at night, the food comes back up. Pt hasn't had any nausea and for the first time this morning, pt reflux wouldn't allow him to swallow. Pt is very nervous about this and states he could barely get his coffee down. Pt has taken his Pantoprazole 40 mg and feels a little better but is very nervous about his swallowing issue. Pt was advised to go to the ED. Please advise.

## 2018-08-20 NOTE — Telephone Encounter (Signed)
Patient came in office wanting to be seen today, stated he is having throat swelling.  Said his pcp gave him something for reflux and it works until dinner time, I told him we could see him Thursday at 8:30 and he said he needed something done before then because he felt like he was dying.

## 2018-08-20 NOTE — Telephone Encounter (Signed)
Reviewed

## 2018-08-20 NOTE — Telephone Encounter (Signed)
We have never seen him for any GERD or dysphagia symptoms that I can see. If his symptoms are severe enough that he's unable to swallow any liquids or handle his secretions (ie- saliva) then he needs to go to the ER. If he decides to wait until we can see him in 2 days then continue Pantoprazole his PCP gave him. Eat full liquids or, if able to tolerate them, soft foods (for example mashed potatoes, mac and cheese, etc). Let me know if any questions.

## 2018-08-20 NOTE — Telephone Encounter (Signed)
Noted. Spoke with pt and his PCP increased his Pantoprazole 40 mg to twice daily. Pt was offered an apt ok per EG in 2 days and pt said he is very surprised that someone didn't work him in today due to him having the issues he was having. He states that he is aware about appointments but felt this was something that needed attention, pt also states that he knows everyone said you need to go to the EB but he is still surprised at the office not working him in.

## 2018-08-22 ENCOUNTER — Encounter: Payer: Self-pay | Admitting: Nurse Practitioner

## 2018-08-22 ENCOUNTER — Ambulatory Visit: Payer: BC Managed Care – PPO | Admitting: Nurse Practitioner

## 2018-08-22 ENCOUNTER — Encounter: Payer: Self-pay | Admitting: *Deleted

## 2018-08-22 VITALS — BP 135/79 | HR 79 | Temp 97.0°F | Ht 74.0 in | Wt 215.6 lb

## 2018-08-22 DIAGNOSIS — R1310 Dysphagia, unspecified: Secondary | ICD-10-CM

## 2018-08-22 DIAGNOSIS — K219 Gastro-esophageal reflux disease without esophagitis: Secondary | ICD-10-CM

## 2018-08-22 DIAGNOSIS — Z86010 Personal history of colonic polyps: Secondary | ICD-10-CM | POA: Diagnosis not present

## 2018-08-22 DIAGNOSIS — R1319 Other dysphagia: Secondary | ICD-10-CM

## 2018-08-22 NOTE — Assessment & Plan Note (Addendum)
History of colon polyps and EGD up-to-date next due in January of this coming year (approximately 3 months from now).  He would like to schedule his colonoscopy the same time as his upper endoscopy.  We will attempt to do this as scheduling allows.  May need to defer colonoscopy to a bit in the future.  He understands this and is agreeable.  We will schedule the colonoscopy today, pending schedule availability.  Follow-up based on recommendations made after his colonoscopy.  Proceed with TCS on propofol/MAC with Dr. Gala Romney in near future: the risks, benefits, and alternatives have been discussed with the patient in detail. The patient states understanding and desires to proceed.  The patient has a history of significant anxiety.  Intermittently takes Xanax.  No other anticoagulants, anxiolytics, chronic pain medications, or antidepressants.  Given this we will plan for the procedure on propofol/MAC to promote adequate sedation.

## 2018-08-22 NOTE — Progress Notes (Signed)
Referring Provider: No ref. provider found Primary Care Physician:  Patient, No Pcp Per Primary GI:  Dr. Gala Romney  Chief Complaint  Patient presents with  . Dysphagia    with foods  . Consult    TCS.    HPI:   Warren Barr is a 52 y.o. male who presents for difficulty swallowing as well as to triage for colonoscopy.  Patient was last seen in our office 11/18/2015 for rectal pain and hematochezia.  At that time his previous colonoscopy had been 2009 with a hyperplastic polyp.  He noted his rectal pain started about a year prior and saw a urgent care due to inability to urinate.  Urology has been following since.  Worsening perineal pain and rectal pain when sitting on hard surfaces and urology diagnosed him with pelvic floor pain and he completed physical therapy with significant improvement.  Also noted toilet tissue hematochezia within the past year.  No other GI symptoms.  Recommended colonoscopy and follow-up based on post procedure recommendations.  Colonoscopy was completed 12/13/2015 which found multiple colon polyps, internal hemorrhoids and rectal bleeding deemed likely acute fissure or expressed hemorrhoidal bleeding.  At the time of his procedure he was asymptomatic from a GI standpoint.  Surgical pathology found the polyps to be sessile serrated polyp without cytologic dysplasia. Recommended repeat colonoscopy in 3 years (January 2020).  Patient presented to our office 08/20/2018 requesting a same-day appointment for throat swelling.  PCP gave him something for reflux which is working until dinnertime.  He was scheduled for first available opening (2 days later.)  At that point he he stated he needed something done before then because he felt like he was dying.  Follow-up phone call indicated worsening reflux in the previous 2 weeks and started on pantoprazole 40 mg daily with symptoms worsen night and unable to eat dinner due to not being able to swallow.  Stated he was very nervous  and could barely get his coffee down.  Feels a little bit better on pantoprazole but very nervous about the swallowing issue and he was advised to go to the ED.  It was noted we have never seen him for GERD or dysphasia symptoms and if he was unable to handle his secretions or swallow any liquids he needed to present to the ED.  In the meantime continue pantoprazole, liquid diet.  Today he states his reflux worsened about 2 weeks ago. Protonix was increased by PCP to bid which has helped some. Is still having swallowing difficulties, especially in the evenings; often can only eat a shake. Feels like stuff gets stuck on the way down (dysphagia). Notes he had throat swelling and pain on the left side. Has had a couple severe instances. Is also wanting to schedule his TCS today as well. Denies abdominal pain, N/V, hematochezia, melena, fever, chills, unintentional weight loss. Denies chest pain, dyspnea, dizziness, lightheadedness, syncope, near syncope. Denies any other upper or lower GI symptoms.  Denies NSAIDs and ASA powders.  Past Medical History:  Diagnosis Date  . Anxiety states   . Dizziness   . PVC (premature ventricular contraction)     Past Surgical History:  Procedure Laterality Date  . COLONOSCOPY N/A 12/13/2015   Procedure: COLONOSCOPY;  Surgeon: Daneil Dolin, MD;  Location: [facility];  Service: Endoscopy;  Laterality: N/A;  245 - moved to 1:45 - office to notify  . Plantar fasciitis     Bilateral  . VASECTOMY  Current Outpatient Medications  Medication Sig Dispense Refill  . ALPRAZolam (XANAX) 0.25 MG tablet Take 1 tablet (0.25 mg total) by mouth 3 (three) times daily as needed (anxiety). 30 tablet 0  . diltiazem (CARDIZEM CD) 120 MG 24 hr capsule Take 1 capsule (120 mg total) by mouth daily. 90 capsule 3   No current facility-administered medications for this visit.     Allergies as of 08/22/2018  . (No Known Allergies)    Family History  Problem Relation Age  of Onset  . Heart failure Father   . Colon cancer Neg Hx     Social History   Socioeconomic History  . Marital status: Married    Spouse name: Not on file  . Number of children: 2  . Years of education: Not on file  . Highest education level: Not on file  Occupational History  . 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 Financial resource strain: Not on file  . Food insecurity:    Worry: Not on file    Inability: Not on file  . Transportation needs:    Medical: Not on file    Non-medical: Not on file  Tobacco Use  . Smoking status: Never Smoker  . Smokeless tobacco: Never Used  Substance and Sexual Activity  . Alcohol use: Yes    Alcohol/week: 0.0 standard drinks    Comment: occas  . Drug use: No  . Sexual activity: Not on file  Lifestyle  . Physical activity:    Days per week: Not on file    Minutes per session: Not on file  . Stress: Not on file  Relationships  . Social connections:    Talks on phone: Not on file    Gets together: Not on file    Attends religious service: Not on file    Active member of club or organization: Not on file    Attends meetings of clubs or organizations: Not on file    Relationship status: Not on file  Other Topics Concern  . Not on file  Social History Narrative  . Not on file    Review of Systems: General: Negative for anorexia, weight loss, fever, chills, fatigue, weakness. ENT: Negative for hoarseness, difficulty swallowing. Sensation of neck swelling. CV: Negative for chest pain, angina, palpitations, peripheral edema.  Respiratory: Negative for dyspnea at rest, cough, sputum, wheezing.  GI: See history of present illness. Endo: Negative for unusual weight change.  Heme: Negative for bruising or bleeding.   Physical Exam: BP 135/79   Pulse 79   Temp (!) 97 F (36.1 C) (Oral)   Ht 6\' 2"  (1.88 m)   Wt 215 lb 9.6 oz (97.8 kg)   BMI 27.68 kg/m    General:   Alert and oriented. Pleasant and cooperative. Well-nourished and well-developed.  Eyes:  Without icterus, sclera clear and conjunctiva pink.  Ears:  Normal auditory acuity. Throat/Neck:  Supple, without mass or thyromegaly or edema. Cardiovascular:  S1, S2 present without murmurs appreciated. Extremities without clubbing or edema. Respiratory:  Clear to auscultation bilaterally. No wheezes, rales, or rhonchi. No distress.  Gastrointestinal:  +BS, soft, non-tender and non-distended. No HSM noted. No guarding or rebound. No masses appreciated.  Rectal:  Deferred  Musculoskalatal:  Symmetrical without gross deformities. Neurologic:  Alert and oriented x4;  grossly normal neurologically. Psych:  Alert and cooperative. Normal mood and affect. Heme/Lymph/Immune: No excessive bruising noted.  08/22/2018 8:57 AM   Disclaimer: This note was dictated with voice recognition software. Similar sounding words can inadvertently be transcribed and may not be corrected upon review.

## 2018-08-22 NOTE — Assessment & Plan Note (Signed)
The patient notes dysphasia symptoms as well as a sensation of throat swelling.  His throat does not appear swollen today.  Given dysphasia symptoms I have given him dysphagia diet recommendations.  We also plan for an ASAP EGD with possible dilation.  Pending results further recommendations to follow.  Follow-up in 3 months.  Proceed with EGD on propofol/MAC with Dr. Gala Romney in near future: the risks, benefits, and alternatives have been discussed with the patient in detail. The patient states understanding and desires to proceed.  The patient has a history of significant anxiety.  Intermittently takes Xanax.  No other anticoagulants, anxiolytics, chronic pain medications, or antidepressants.  Given this we will plan for the procedure on propofol/MAC to promote adequate sedation.

## 2018-08-22 NOTE — Patient Instructions (Addendum)
1. We will schedule your colonoscopy and upper endoscopy with dilation for you. 2. I am providing information below about a soft diet to help with swallowing concerns. 3. Return for follow-up in 3 months. 4. Otherwise, continue your current medications. 5. Call us if you have any questions or concerns.  At [facility] we value your feedback. You may receive a survey about your visit today. Please share your experience as we strive to create trusting relationships with our patients to provide genuine, compassionate, quality care.  We appreciate your understanding and patience as we review any laboratory studies, imaging, and other diagnostic tests that are ordered as we care for you. Our office policy is 5 business days for review of these results, and any emergent or urgent results are addressed in a timely manner for your best interest. If you do not hear from our office in 1 week, please contact us.   We also encourage the use of MyChart, which contains your medical information for your review as well. If you are not enrolled in this feature, an access code is on this after visit summary for your convenience. Thank you for allowing [place] to be involved in your care.  It was great to see you today!  I hope you have a great Fall!!     Dysphagia Diet Level 2, Mechanically Altered The dysphagia level 2 diet includes foods that are blended, chopped, ground, or mashed so they are easier to chew and swallow. The foods are soft, moist, and can be chopped into -inch chunks (such as pancakes, pasta, and bananas). In order to be on this diet, you must be able to chew. This diet helps you transition between the pureed textures of the dysphagia level 1 diet to more solid textures. This diet is helpful for people with mild to moderate swallowing difficulties. It reduces the risk of food getting caught in the windpipe, trachea, or lungs. You may need help or supervision during meals while  following this diet so that you eat safely. You will be on this diet until your health care provider advances the texture of your diet. What do I need to know about this diet? Foods  You may eat foods that are soft and moist. ? You may need to use a blender, whisk, or masher to soften some of your foods. ? You can moisten foods with gravies, sauces, vegetable or fruit juice, milk, half and half, or water when blending, mashing, or grinding your foods to the right consistency.  If you were on the dysphagia level 1 diet, you may still eat any of the foods included in that diet.  Avoid foods that are dry, hard, sticky, chewy, coarse, and crunchy. Also avoid large cuts of food.  Take small bites. Each bite should contain  inch or less of food. Liquids  Avoid liquids with seeds and chunks.  Thicken liquids, if instructed by your health care provider. Your health care provider will tell you the consistency to which you should thicken your liquids for safe swallowing. To thicken a liquid, use a commercial thickener or a thickening food (such as rice cereal or potato flakes). Ask your health care provider for specific recommendations on thickeners. See your dietitian or health care provider regularly for help with your dietary changes. What foods can I eat? Grains Store-bought soft breads that do not have nuts or seeds. Pancakes, sweet rolls, [place] pastries, and [place] toast that have been moistened with syrup or sauce to form a  slurry when blended. Well-cooked pasta, noodles, and bread dressing. Well-cooked noodles and pasta in sauce. Moist macaroni and cheese. Soft dumplings or spaetzle with gravy or butter. Cooked cereals (including oatmeal). Low-texture dry cereals, such as rice puff, corn, or wheat-flake cereals, with milk (if thin liquids are not allowed, make sure all of the milk is absorbed by the cereal before eating it). Vegetables Very soft, well-cooked vegetables in pieces less than   inch in size. Cooked potatoes that are moist, not crispy, and with sauce. Fruits Canned or cooked fruits that are soft or moist and do not have skin or seeds. Fresh, soft bananas. Fruit juices with a small amount of pulp (if thin liquids are allowed). Gelatin or plain gelatin with canned fruit, except pineapple. Meat and Other Protein Sources Tender, moist meats, poultry, or fish cooked with gravy or sauce and cubed to -inch bites or smaller. Ground meat. Moist meatball or meatloaf. Fish without bones. Moist casseroles without rice. Tuna, egg, or meat salad without chunks or hard-to-chew vegetables, such as celery and onions. Smooth quiche without large chunks. Scrambled, poached, or soft-cooked eggs with butter, margarine, sauce, or gravy. Tofu. Well-cooked, moistened and mashed beans, peas, baked beans, and other legumes. Casseroles without rice (such as tuna noodle casserole or soft moist meat lasagna). Dairy Cream cheese. Yogurt. Cottage cheese. Ask your health care provider if milk is allowed. Sweets/Desserts Pudding. Custard. Soft fruit pies with crust on the bottom only. Crisps and cobblers without seeds or nuts and with soft crusts. Soft, moist cakes. Icing. Pre-gelled cookies. Soft, moist cookies dunked in milk, coffee, or another liquid. Jelly. Soft, smooth chocolate bars that are easily chewed. Jams and preserves without seeds. Ask your health care provider whether you can have frozen desserts. Fats and Oils Butter. Margarine. Cream for cereal, depending on liquid consistency allowed. Gravy. Cream sauces. Mayonnaise. Salad dressings. Cream cheese. Cheese spreads, plain or with soft fruits or vegetables added. Sour cream. Sour cream dips with soft fruits or vegetables added. Whipped toppings. Other Sauces and salsas that have soft chunks that are about  inch or smaller. The items listed above may not be a complete list of recommended foods or beverages. Contact your dietitian for more  options. What foods are not recommended? Grains All breads not listed in the recommended list. Breads that are hard or have nuts or seeds. Coarse cereals. Cereals that have nuts, seeds, dried fruits, or coconut. Rice. Corn. Vegetables Whole, raw, frozen, or dried vegetables. Tough, fibrous, chewy, or stringy cooked vegetables, such as celery, peas, broccoli, cabbage, Brussels sprouts, and asparagus. Potato skins. Potato and other vegetable chips. Fried or French-fried potatoes. Cooked corn and peas. Fruits Whole raw, frozen, or dried fruits, including coconut. Pineapple. Fruits with seeds. Meat and Other Protein Sources Dry, tough meats, such as bacon, sausage, and hot dogs. Cheese slices and cubes. Peanut butter. Hard boiled or fried eggs. Nuts. Seeds. Pizza. Sandwiches. Dry casseroles or casseroles with rice or large chunks. Dairy Yogurt with nuts, seeds, or large chunks. Sweets/Desserts Coarse, hard, chewy, or sticky desserts. Any dessert with nuts, seeds, coconut, pineapple, or dried fruit. Ask your health care provider whether you can have frozen desserts. Fats and Oils Avoid fats with chunky, large textures, such as those with nuts or fruits. Other Soups and casseroles with large chunks. The items listed above may not be a complete list of foods and beverages to avoid. Contact your dietitian for more information. This information is not intended to replace advice given to  you by your health care provider. Make sure you discuss any questions you have with your health care provider. Document Released: 11/13/2005 Document Revised: 04/20/2016 Document Reviewed: 10/27/2013 Elsevier Interactive Patient Education  [employer].

## 2018-08-22 NOTE — H&P (View-Only) (Signed)
Referring Provider: No ref. provider found Primary Care Physician:  Patient, No Pcp Per Primary GI:  Dr. Gala Romney  Chief Complaint  Patient presents with  . Dysphagia    with foods  . Consult    TCS.    HPI:   Warren Barr is a 52 y.o. male who presents for difficulty swallowing as well as to triage for colonoscopy.  Patient was last seen in our office 11/18/2015 for rectal pain and hematochezia.  At that time his previous colonoscopy had been 2009 with a hyperplastic polyp.  He noted his rectal pain started about a year prior and saw a urgent care due to inability to urinate.  Urology has been following since.  Worsening perineal pain and rectal pain when sitting on hard surfaces and urology diagnosed him with pelvic floor pain and he completed physical therapy with significant improvement.  Also noted toilet tissue hematochezia within the past year.  No other GI symptoms.  Recommended colonoscopy and follow-up based on post procedure recommendations.  Colonoscopy was completed 12/13/2015 which found multiple colon polyps, internal hemorrhoids and rectal bleeding deemed likely acute fissure or expressed hemorrhoidal bleeding.  At the time of his procedure he was asymptomatic from a GI standpoint.  Surgical pathology found the polyps to be sessile serrated polyp without cytologic dysplasia. Recommended repeat colonoscopy in 3 years (January 2020).  Patient presented to our office 08/20/2018 requesting a same-day appointment for throat swelling.  PCP gave him something for reflux which is working until dinnertime.  He was scheduled for first available opening (2 days later.)  At that point he he stated he needed something done before then because he felt like he was dying.  Follow-up phone call indicated worsening reflux in the previous 2 weeks and started on pantoprazole 40 mg daily with symptoms worsen night and unable to eat dinner due to not being able to swallow.  Stated he was very nervous  and could barely get his coffee down.  Feels a little bit better on pantoprazole but very nervous about the swallowing issue and he was advised to go to the ED.  It was noted we have never seen him for GERD or dysphasia symptoms and if he was unable to handle his secretions or swallow any liquids he needed to present to the ED.  In the meantime continue pantoprazole, liquid diet.  Today he states his reflux worsened about 2 weeks ago. Protonix was increased by PCP to bid which has helped some. Is still having swallowing difficulties, especially in the evenings; often can only eat a shake. Feels like stuff gets stuck on the way down (dysphagia). Notes he had throat swelling and pain on the left side. Has had a couple severe instances. Is also wanting to schedule his TCS today as well. Denies abdominal pain, N/V, hematochezia, melena, fever, chills, unintentional weight loss. Denies chest pain, dyspnea, dizziness, lightheadedness, syncope, near syncope. Denies any other upper or lower GI symptoms.  Denies NSAIDs and ASA powders.  Past Medical History:  Diagnosis Date  . Anxiety states   . Dizziness   . PVC (premature ventricular contraction)     Past Surgical History:  Procedure Laterality Date  . COLONOSCOPY N/A 12/13/2015   Procedure: COLONOSCOPY;  Surgeon: Daneil Dolin, MD;  Location: [facility];  Service: Endoscopy;  Laterality: N/A;  245 - moved to 1:45 - office to notify  . Plantar fasciitis     Bilateral  . VASECTOMY  Current Outpatient Medications  Medication Sig Dispense Refill  . ALPRAZolam (XANAX) 0.25 MG tablet Take 1 tablet (0.25 mg total) by mouth 3 (three) times daily as needed (anxiety). 30 tablet 0  . diltiazem (CARDIZEM CD) 120 MG 24 hr capsule Take 1 capsule (120 mg total) by mouth daily. 90 capsule 3   No current facility-administered medications for this visit.     Allergies as of 08/22/2018  . (No Known Allergies)    Family History  Problem Relation Age  of Onset  . Heart failure Father   . Colon cancer Neg Hx     Social History   Socioeconomic History  . Marital status: Married    Spouse name: Not on file  . Number of children: 2  . Years of education: Not on file  . Highest education level: Not on file  Occupational History  . 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 Financial resource strain: Not on file  . Food insecurity:    Worry: Not on file    Inability: Not on file  . Transportation needs:    Medical: Not on file    Non-medical: Not on file  Tobacco Use  . Smoking status: Never Smoker  . Smokeless tobacco: Never Used  Substance and Sexual Activity  . Alcohol use: Yes    Alcohol/week: 0.0 standard drinks    Comment: occas  . Drug use: No  . Sexual activity: Not on file  Lifestyle  . Physical activity:    Days per week: Not on file    Minutes per session: Not on file  . Stress: Not on file  Relationships  . Social connections:    Talks on phone: Not on file    Gets together: Not on file    Attends religious service: Not on file    Active member of club or organization: Not on file    Attends meetings of clubs or organizations: Not on file    Relationship status: Not on file  Other Topics Concern  . Not on file  Social History Narrative  . Not on file    Review of Systems: General: Negative for anorexia, weight loss, fever, chills, fatigue, weakness. ENT: Negative for hoarseness, difficulty swallowing. Sensation of neck swelling. CV: Negative for chest pain, angina, palpitations, peripheral edema.  Respiratory: Negative for dyspnea at rest, cough, sputum, wheezing.  GI: See history of present illness. Endo: Negative for unusual weight change.  Heme: Negative for bruising or bleeding.   Physical Exam: BP 135/79   Pulse 79   Temp (!) 97 F (36.1 C) (Oral)   Ht 6\' 2"  (1.88 m)   Wt 215 lb 9.6 oz (97.8 kg)   BMI 27.68 kg/m    General:   Alert and oriented. Pleasant and cooperative. Well-nourished and well-developed.  Eyes:  Without icterus, sclera clear and conjunctiva pink.  Ears:  Normal auditory acuity. Throat/Neck:  Supple, without mass or thyromegaly or edema. Cardiovascular:  S1, S2 present without murmurs appreciated. Extremities without clubbing or edema. Respiratory:  Clear to auscultation bilaterally. No wheezes, rales, or rhonchi. No distress.  Gastrointestinal:  +BS, soft, non-tender and non-distended. No HSM noted. No guarding or rebound. No masses appreciated.  Rectal:  Deferred  Musculoskalatal:  Symmetrical without gross deformities. Neurologic:  Alert and oriented x4;  grossly normal neurologically. Psych:  Alert and cooperative. Normal mood and affect. Heme/Lymph/Immune: No excessive bruising noted.  08/22/2018 8:57 AM   Disclaimer: This note was dictated with voice recognition software. Similar sounding words can inadvertently be transcribed and may not be corrected upon review.

## 2018-08-22 NOTE — Progress Notes (Signed)
NO PCP PER PATIENT °

## 2018-08-22 NOTE — Assessment & Plan Note (Signed)
Continue twice daily Protonix.  Dexilant may offer more relief however we currently do not have samples and should be getting them in early next week after he completes his endoscopy.  Pending findings he may need to switch PPIs.  Follow-up in 3 months.

## 2018-08-23 ENCOUNTER — Other Ambulatory Visit: Payer: Self-pay | Admitting: *Deleted

## 2018-08-23 ENCOUNTER — Telehealth: Payer: Self-pay | Admitting: *Deleted

## 2018-08-23 DIAGNOSIS — Z86010 Personal history of colonic polyps: Secondary | ICD-10-CM

## 2018-08-23 DIAGNOSIS — R1310 Dysphagia, unspecified: Secondary | ICD-10-CM

## 2018-08-23 DIAGNOSIS — K219 Gastro-esophageal reflux disease without esophagitis: Secondary | ICD-10-CM

## 2018-08-23 DIAGNOSIS — R1319 Other dysphagia: Secondary | ICD-10-CM

## 2018-08-23 MED ORDER — PEG 3350-KCL-NA BICARB-NACL 420 G PO SOLR: 4000.0000 mL | Freq: Once | ORAL | 0 refills | Status: AC

## 2018-08-23 NOTE — Telephone Encounter (Signed)
Patient called back and is aware. Prep Rx sent to [facility]. I have also faxed prep instructions to them. Received confirmation it went through. Patient aware the pharmacy will give him these instructions. I did discuss instructions with him in detail. He is also reminded he has pre-op scheduled for 08/26/18.

## 2018-08-23 NOTE — Telephone Encounter (Signed)
Spoke with RMR and agree'd to add TCS to EGD scheduled for 08/29/18.   Called patient and LMOVM. Patient was already aware if he was able to have TCS done that

## 2018-08-23 NOTE — Telephone Encounter (Signed)
Confirmed with [facility] pharmacy instructions were received and aware to provide to patient.

## 2018-08-26 ENCOUNTER — Encounter (HOSPITAL_COMMUNITY)
Admission: RE | Admit: 2018-08-26 | Discharge: 2018-08-26 | Disposition: A | Discharge status: Home / Self Care | Payer: BC Managed Care – PPO | Source: Ambulatory Visit | Attending: Internal Medicine | Admitting: Internal Medicine

## 2018-08-26 ENCOUNTER — Encounter (HOSPITAL_COMMUNITY): Payer: Self-pay

## 2018-08-26 DIAGNOSIS — Z01812 Encounter for preprocedural laboratory examination: Secondary | ICD-10-CM | POA: Diagnosis not present

## 2018-08-26 LAB — BASIC METABOLIC PANEL
ANION GAP: 8 (ref 5–15)
BUN: 21 mg/dL — ABNORMAL HIGH (ref 6–20)
CHLORIDE: 104 mmol/L (ref 98–111)
CO2: 25 mmol/L (ref 22–32)
CREATININE: 1.2 mg/dL (ref 0.61–1.24)
Calcium: 9.2 mg/dL (ref 8.9–10.3)
GFR calc non Af Amer: >60 mL/min (ref >60)
Glucose, Bld: 94 mg/dL (ref 70–99)
Potassium: 4.1 mmol/L (ref 3.5–5.1)
Sodium: 137 mmol/L (ref 135–145)

## 2018-08-26 LAB — CBC WITH DIFFERENTIAL/PLATELET
BASOS ABS: 0 10*3/uL (ref 0.0–0.1)
BASOS PCT: 1 %
Eosinophils Absolute: 0.3 10*3/uL (ref 0.0–0.7)
Eosinophils Relative: 4 %
HEMATOCRIT: 46.2 % (ref 39.0–52.0)
HEMOGLOBIN: 15.8 g/dL (ref 13.0–17.0)
LYMPHS PCT: 30 %
Lymphs Abs: 1.9 10*3/uL (ref 0.7–4.0)
MCH: 30.3 pg (ref 26.0–34.0)
MCHC: 34.2 g/dL (ref 30.0–36.0)
MCV: 88.7 fL (ref 78.0–100.0)
MONO ABS: 0.6 10*3/uL (ref 0.1–1.0)
Monocytes Relative: 10 %
NEUTROS ABS: 3.4 10*3/uL (ref 1.7–7.7)
NEUTROS PCT: 55 %
Platelets: 210 10*3/uL (ref 150–400)
RBC: 5.21 MIL/uL (ref 4.22–5.81)
RDW: 12.8 % (ref 11.5–15.5)
WBC: 6.1 10*3/uL (ref 4.0–10.5)

## 2018-08-26 NOTE — Patient Instructions (Signed)
Warren Barr  08/26/2018     @PREFPERIOPPHARMACY @   Your procedure is scheduled on  08/29/2018  Report to [facility] at  745  A.M.  Call this number if you have problems the morning of surgery:  [phone]   Remember:  Do not eat or drink after midnight.  You may drink clear liquids until (follow the instructions given to you) .  Clear liquids allowed are:                    Water, Juice (non-citric and without pulp), Carbonated beverages, Clear Tea, Black Coffee only, Plain Jell-O only, Gatorade and Plain Popsicles only    Take these medicines the morning of surgery with A SIP OF WATER  Xanax, cardiazem, protonix.    Do not wear jewelry, make-up or nail polish.  Do not wear lotions, powders, or perfumes, or deodorant.  Do not shave 48 hours prior to surgery.  Men may shave face and neck.  Do not bring valuables to the hospital.  [facility] is not responsible for any belongings or valuables.  Contacts, dentures or bridgework may not be worn into surgery.  Leave your suitcase in the car.  After surgery it may be brought to your room.  For patients admitted to the hospital, discharge time will be determined by your treatment team.  Patients discharged the day of surgery will not be allowed to drive home.   Name and phone number of your driver:   family Special instructions:  Follow the diet and prep instructions given to you by Dr Roseanne Kaufman office.  Please read over the following fact sheets that you were given. Anesthesia Post-op Instructions and Care and Recovery After Surgery       Esophagogastroduodenoscopy Esophagogastroduodenoscopy (EGD) is a procedure to examine the lining of the esophagus, stomach, and first part of the small intestine (duodenum). This procedure is done to check for problems such as inflammation, bleeding, ulcers, or growths. During this procedure, a long, flexible, lighted tube with a camera attached (endoscope) is inserted  down the throat. Tell a health care provider about:  Any allergies you have.  All medicines you are taking, including vitamins, herbs, eye drops, creams, and over-the-counter medicines.  Any problems you or family members have had with anesthetic medicines.  Any blood disorders you have.  Any surgeries you have had.  Any medical conditions you have.  Whether you are pregnant or may be pregnant. What are the risks? Generally, this is a safe procedure. However, problems may occur, including:  Infection.  Bleeding.  A tear (perforation) in the esophagus, stomach, or duodenum.  Trouble breathing.  Excessive sweating.  Spasms of the larynx.  A slowed heartbeat.  Low blood pressure.  What happens before the procedure?  Follow instructions from your health care provider about eating or drinking restrictions.  Ask your health care provider about: ? Changing or stopping your regular medicines. This is especially important if you are taking diabetes medicines or blood thinners. ? Taking medicines such as aspirin and ibuprofen. These medicines can thin your blood. Do not take these medicines before your procedure if your health care provider instructs you not to.  Plan to have someone take you home after the procedure.  If you wear dentures, be ready to remove them before the procedure. What happens during the procedure?  To reduce your risk of infection, your health care  team will wash or sanitize their hands.  An IV tube will be put in a vein in your hand or arm. You will get medicines and fluids through this tube.  You will be given one or more of the following: ? A medicine to help you relax (sedative). ? A medicine to numb the area (local anesthetic). This medicine may be sprayed into your throat. It will make you feel more comfortable and keep you from gagging or coughing during the procedure. ? A medicine for pain.  A mouth guard may be placed in your mouth to  protect your teeth and to keep you from biting on the endoscope.  You will be asked to lie on your left side.  The endoscope will be lowered down your throat into your esophagus, stomach, and duodenum.  Air will be put into the endoscope. This will help your health care provider see better.  The lining of your esophagus, stomach, and duodenum will be examined.  Your health care provider may: ? Take a tissue sample so it can be looked at in a lab (biopsy). ? Remove growths. ? Remove objects (foreign bodies) that are stuck. ? Treat any bleeding with medicines or other devices that stop tissue from bleeding. ? Widen (dilate) or stretch narrowed areas of your esophagus and stomach.  The endoscope will be taken out. The procedure may vary among health care providers and hospitals. What happens after the procedure?  Your blood pressure, heart rate, breathing rate, and blood oxygen level will be monitored often until the medicines you were given have worn off.  Do not eat or drink anything until the numbing medicine has worn off and your gag reflex has returned. This information is not intended to replace advice given to you by your health care provider. Make sure you discuss any questions you have with your health care provider. Document Released: 03/16/2005 Document Revised: 04/20/2016 Document Reviewed: 10/07/2015 Elsevier Interactive Patient Education  2018 [employer]. Esophagogastroduodenoscopy, Care After Refer to this sheet in the next few weeks. These instructions provide you with information about caring for yourself after your procedure. Your health care provider may also give you more specific instructions. Your treatment has been planned according to current medical practices, but problems sometimes occur. Call your health care provider if you have any problems or questions after your procedure. What can I expect after the procedure? After the procedure, it is common to  have:  A sore throat.  Nausea.  Bloating.  Dizziness.  Fatigue.  Follow these instructions at home:  Do not eat or drink anything until the numbing medicine (local anesthetic) has worn off and your gag reflex has returned. You will know that the local anesthetic has worn off when you can swallow comfortably.  Do not drive for 24 hours if you received a medicine to help you relax (sedative).  If your health care provider took a tissue sample for testing during the procedure, make sure to get your test results. This is your responsibility. Ask your health care provider or the department performing the test when your results will be ready.  Keep all follow-up visits as told by your health care provider. This is important. Contact a health care provider if:  You cannot stop coughing.  You are not urinating.  You are urinating less than usual. Get help right away if:  You have trouble swallowing.  You cannot eat or drink.  You have throat or chest pain that gets worse.  You are dizzy or light-headed.  You faint.  You have nausea or vomiting.  You have chills.  You have a fever.  You have severe abdominal pain.  You have black, tarry, or bloody stools. This information is not intended to replace advice given to you by your health care provider. Make sure you discuss any questions you have with your health care provider. Document Released: 10/30/2012 Document Revised: 04/20/2016 Document Reviewed: 10/07/2015 Elsevier Interactive Patient Education  2018 [employer].  Esophageal Dilatation Esophageal dilatation is a procedure to open a blocked or narrowed part of the esophagus. The esophagus is the long tube in your throat that carries food and liquid from your mouth to your stomach. The procedure is also called esophageal dilation. You may need this procedure if you have a buildup of scar tissue in your esophagus that makes it difficult, painful, or even impossible to  swallow. This can be caused by gastroesophageal reflux disease (GERD). In rare cases, people need this procedure because they have cancer of the esophagus or a problem with the way food moves through the esophagus. Sometimes you may need to have another dilatation to enlarge the opening of the esophagus gradually. Tell a health care provider about:  Any allergies you have.  All medicines you are taking, including vitamins, herbs, eye drops, creams, and over-the-counter medicines.  Any problems you or family members have had with anesthetic medicines.  Any blood disorders you have.  Any surgeries you have had.  Any medical conditions you have.  Any antibiotic medicines you are required to take before dental procedures. What are the risks? Generally, this is a safe procedure. However, problems can occur and include:  Bleeding from a tear in the lining of the esophagus.  A hole (perforation) in the esophagus.  What happens before the procedure?  Do not eat or drink anything after midnight on the night before the procedure or as directed by your health care provider.  Ask your health care provider about changing or stopping your regular medicines. This is especially important if you are taking diabetes medicines or blood thinners.  Plan to have someone take you home after the procedure. What happens during the procedure?  You will be given a medicine that makes you relaxed and sleepy (sedative).  A medicine may be sprayed or gargled to numb the back of the throat.  Your health care provider can use various instruments to do an esophageal dilatation. During the procedure, the instrument used will be placed in your mouth and passed down into your esophagus. Options include: ? Simple dilators. This instrument is carefully placed in the esophagus to stretch it. ? Guided wire bougies. In this method, a flexible tube (endoscope) is used to insert a wire into the esophagus. The dilator is  passed over this wire to enlarge the esophagus. Then the wire is removed. ? Balloon dilators. An endoscope with a small balloon at the end is passed down into the esophagus. Inflating the balloon gently stretches the esophagus and opens it up. What happens after the procedure?  Your blood pressure, heart rate, breathing rate, and blood oxygen level will be monitored often until the medicines you were given have worn off.  Your throat may feel slightly sore and will probably still feel numb. This will improve slowly over time.  You will not be allowed to eat or drink until the throat numbness has resolved.  If this is a same-day procedure, you may be allowed to go home  once you have been able to drink, urinate, and sit on the edge of the bed without nausea or dizziness.  If this is a same-day procedure, you should have a friend or family member with you for the next 24 hours after the procedure. This information is not intended to replace advice given to you by your health care provider. Make sure you discuss any questions you have with your health care provider. Document Released: 01/04/2006 Document Revised: 04/20/2016 Document Reviewed: 03/25/2014 Elsevier Interactive Patient Education  [employer].  Colonoscopy, Adult A colonoscopy is an exam to look at the large intestine. It is done to check for problems, such as:  Lumps (tumors).  Growths (polyps).  Swelling (inflammation).  Bleeding.  What happens before the procedure? Eating and drinking Follow instructions from your doctor about eating and drinking. These instructions may include:  A few days before the procedure - follow a low-fiber diet. ? Avoid nuts. ? Avoid seeds. ? Avoid dried fruit. ? Avoid raw fruits. ? Avoid vegetables.  1-3 days before the procedure - follow a clear liquid diet. Avoid liquids that have red or purple dye. Drink only clear liquids, such as: ? Clear broth or bouillon. ? Black coffee or  tea. ? Clear juice. ? Clear soft drinks or sports drinks. ? Gelatin dessert. ? Popsicles.  On the day of the procedure - do not eat or drink anything during the 2 hours before the procedure.  Bowel prep If you were prescribed an oral bowel prep:  Take it as told by your doctor. Starting the day before your procedure, you will need to drink a lot of liquid. The liquid will cause you to poop (have bowel movements) until your poop is almost clear or light green.  If your skin or butt gets irritated from diarrhea, you may: ? Wipe the area with wipes that have medicine in them, such as adult wet wipes with aloe and vitamin E. ? Put something on your skin that soothes the area, such as petroleum jelly.  If you throw up (vomit) while drinking the bowel prep, take a break for up to 60 minutes. Then begin the bowel prep again. If you keep throwing up and you cannot take the bowel prep without throwing up, call your doctor.  General instructions  Ask your doctor about changing or stopping your normal medicines. This is important if you take diabetes medicines or blood thinners.  Plan to have someone take you home from the hospital or clinic. What happens during the procedure?  An IV tube may be put into one of your veins.  You will be given medicine to help you relax (sedative).  To reduce your risk of infection: ? Your doctors will wash their hands. ? Your anal area will be washed with soap.  You will be asked to lie on your side with your knees bent.  Your doctor will get a long, thin, flexible tube ready. The tube will have a camera and a light on the end.  The tube will be put into your anus.  The tube will be gently put into your large intestine.  Air will be delivered into your large intestine to keep it open. You may feel some pressure or cramping.  The camera will be used to take photos.  A small tissue sample may be removed from your body to be looked at under a  microscope (biopsy). If any possible problems are found, the tissue will be sent to a  lab for testing.  If small growths are found, your doctor may remove them and have them checked for cancer.  The tube that was put into your anus will be slowly removed. The procedure may vary among doctors and hospitals. What happens after the procedure?  Your doctor will check on you often until the medicines you were given have worn off.  Do not drive for 24 hours after the procedure.  You may have a small amount of blood in your poop.  You may pass gas.  You may have mild cramps or bloating in your belly (abdomen).  It is up to you to get the results of your procedure. Ask your doctor, or the department performing the procedure, when your results will be ready. This information is not intended to replace advice given to you by your health care provider. Make sure you discuss any questions you have with your health care provider. Document Released: 12/16/2010 Document Revised: 09/13/2016 Document Reviewed: 01/25/2016 Elsevier Interactive Patient Education  2017 [employer]  Colonoscopy, Adult, Care After This sheet gives you information about how to care for yourself after your procedure. Your health care provider may also give you more specific instructions. If you have problems or questions, contact your health care provider. What can I expect after the procedure? After the procedure, it is common to have:  A small amount of blood in your stool for 24 hours after the procedure.  Some gas.  Mild abdominal cramping or bloating.  Follow these instructions at home: General instructions   For the first 24 hours after the procedure: ? Do not drive or use machinery. ? Do not sign important documents. ? Do not drink alcohol. ? Do your regular daily activities at a slower pace than normal. ? Eat soft, easy-to-digest foods. ? Rest often.  Take over-the-counter or prescription medicines  only as told by your health care provider.  It is up to you to get the results of your procedure. Ask your health care provider, or the department performing the procedure, when your results will be ready. Relieving cramping and bloating  Try walking around when you have cramps or feel bloated.  Apply heat to your abdomen as told by your health care provider. Use a heat source that your health care provider recommends, such as a moist heat pack or a heating pad. ? Place a towel between your skin and the heat source. ? Leave the heat on for 20-30 minutes. ? Remove the heat if your skin turns bright red. This is especially important if you are unable to feel pain, heat, or cold. You may have a greater risk of getting burned. Eating and drinking  Drink enough fluid to keep your urine clear or pale yellow.  Resume your normal diet as instructed by your health care provider. Avoid heavy or fried foods that are hard to digest.  Avoid drinking alcohol for as long as instructed by your health care provider. Contact a health care provider if:  You have blood in your stool 2-3 days after the procedure. Get help right away if:  You have more than a small spotting of blood in your stool.  You pass large blood clots in your stool.  Your abdomen is swollen.  You have nausea or vomiting.  You have a fever.  You have increasing abdominal pain that is not relieved with medicine. This information is not intended to replace advice given to you by your health care provider. Make sure you  discuss any questions you have with your health care provider. Document Released: 06/27/2004 Document Revised: 08/07/2016 Document Reviewed: 01/25/2016 Elsevier Interactive Patient Education  2018 [place] Anesthesia is a term that refers to techniques, procedures, and medicines that help a person stay safe and comfortable during a medical procedure. Monitored anesthesia care, or  sedation, is one type of anesthesia. Your anesthesia specialist may recommend sedation if you will be having a procedure that does not require you to be unconscious, such as:  Cataract surgery.  A dental procedure.  A biopsy.  A colonoscopy.  During the procedure, you may receive a medicine to help you relax (sedative). There are three levels of sedation:  Mild sedation. At this level, you may feel awake and relaxed. You will be able to follow directions.  Moderate sedation. At this level, you will be sleepy. You may not remember the procedure.  Deep sedation. At this level, you will be asleep. You will not remember the procedure.  The more medicine you are given, the deeper your level of sedation will be. Depending on how you respond to the procedure, the anesthesia specialist may change your level of sedation or the type of anesthesia to fit your needs. An anesthesia specialist will monitor you closely during the procedure. Let your health care provider know about:  Any allergies you have.  All medicines you are taking, including vitamins, herbs, eye drops, creams, and over-the-counter medicines.  Any use of steroids (by mouth or as a cream).  Any problems you or family members have had with sedatives and anesthetic medicines.  Any blood disorders you have.  Any surgeries you have had.  Any medical conditions you have, such as sleep apnea.  Whether you are pregnant or may be pregnant.  Any use of cigarettes, alcohol, or street drugs. What are the risks? Generally, this is a safe procedure. However, problems may occur, including:  Getting too much medicine (oversedation).  Nausea.  Allergic reaction to medicines.  Trouble breathing. If this happens, a breathing tube may be used to help with breathing. It will be removed when you are awake and breathing on your own.  Heart trouble.  Lung trouble.  Before the procedure Staying hydrated Follow instructions from  your health care provider about hydration, which may include:  Up to 2 hours before the procedure - you may continue to drink clear liquids, such as water, clear fruit juice, black coffee, and plain tea.  Eating and drinking restrictions Follow instructions from your health care provider about eating and drinking, which may include:  8 hours before the procedure - stop eating heavy meals or foods such as meat, fried foods, or fatty foods.  6 hours before the procedure - stop eating light meals or foods, such as toast or cereal.  6 hours before the procedure - stop drinking milk or drinks that contain milk.  2 hours before the procedure - stop drinking clear liquids.  Medicines Ask your health care provider about:  Changing or stopping your regular medicines. This is especially important if you are taking diabetes medicines or blood thinners.  Taking medicines such as aspirin and ibuprofen. These medicines can thin your blood. Do not take these medicines before your procedure if your health care provider instructs you not to.  Tests and exams  You will have a physical exam.  You may have blood tests done to show: ? How well your kidneys and liver are working. ? How well your  blood can clot.  General instructions  Plan to have someone take you home from the hospital or clinic.  If you will be going home right after the procedure, plan to have someone with you for 24 hours.  What happens during the procedure?  Your blood pressure, heart rate, breathing, level of pain and overall condition will be monitored.  An IV tube will be inserted into one of your veins.  Your anesthesia specialist will give you medicines as needed to keep you comfortable during the procedure. This may mean changing the level of sedation.  The procedure will be performed. After the procedure  Your blood pressure, heart rate, breathing rate, and blood oxygen level will be monitored until the medicines  you were given have worn off.  Do not drive for 24 hours if you received a sedative.  You may: ? Feel sleepy, clumsy, or nauseous. ? Feel forgetful about what happened after the procedure. ? Have a sore throat if you had a breathing tube during the procedure. ? Vomit. This information is not intended to replace advice given to you by your health care provider. Make sure you discuss any questions you have with your health care provider. Document Released: 08/09/2005 Document Revised: 04/21/2016 Document Reviewed: 03/05/2016 Elsevier Interactive Patient Education  2018 [place], Care After These instructions provide you with information about caring for yourself after your procedure. Your health care provider may also give you more specific instructions. Your treatment has been planned according to current medical practices, but problems sometimes occur. Call your health care provider if you have any problems or questions after your procedure. What can I expect after the procedure? After your procedure, it is common to:  Feel sleepy for several hours.  Feel clumsy and have poor balance for several hours.  Feel forgetful about what happened after the procedure.  Have poor judgment for several hours.  Feel nauseous or vomit.  Have a sore throat if you had a breathing tube during the procedure.  Follow these instructions at home: For at least 24 hours after the procedure:   Do not: ? Participate in activities in which you could fall or become injured. ? Drive. ? Use heavy machinery. ? Drink alcohol. ? Take sleeping pills or medicines that cause drowsiness. ? Make important decisions or sign legal documents. ? Take care of children on your own.  Rest. Eating and drinking  Follow the diet that is recommended by your health care provider.  If you vomit, drink water, juice, or soup when you can drink without vomiting.  Make sure you have little  or no nausea before eating solid foods. General instructions  Have a responsible adult stay with you until you are awake and alert.  Take over-the-counter and prescription medicines only as told by your health care provider.  If you smoke, do not smoke without supervision.  Keep all follow-up visits as told by your health care provider. This is important. Contact a health care provider if:  You keep feeling nauseous or you keep vomiting.  You feel light-headed.  You develop a rash.  You have a fever. Get help right away if:  You have trouble breathing. This information is not intended to replace advice given to you by your health care provider. Make sure you discuss any questions you have with your health care provider. Document Released: 03/05/2016 Document Revised: 07/05/2016 Document Reviewed: 03/05/2016 Elsevier Interactive Patient Education  [employer].

## 2018-08-27 ENCOUNTER — Ambulatory Visit: Payer: BC Managed Care – PPO

## 2018-08-29 ENCOUNTER — Ambulatory Visit (HOSPITAL_COMMUNITY): Payer: BC Managed Care – PPO | Admitting: Anesthesiology

## 2018-08-29 ENCOUNTER — Encounter (HOSPITAL_COMMUNITY)
Admission: RE | Disposition: A | Discharge status: Home / Self Care | Payer: Self-pay | Source: Ambulatory Visit | Attending: Internal Medicine

## 2018-08-29 ENCOUNTER — Encounter (HOSPITAL_COMMUNITY): Payer: Self-pay | Admitting: *Deleted

## 2018-08-29 ENCOUNTER — Ambulatory Visit (HOSPITAL_COMMUNITY)
Admission: RE | Admit: 2018-08-29 | Discharge: 2018-08-29 | Disposition: A | Discharge status: Home / Self Care | Payer: BC Managed Care – PPO | Source: Ambulatory Visit | Attending: Internal Medicine | Admitting: Internal Medicine

## 2018-08-29 ENCOUNTER — Other Ambulatory Visit: Payer: Self-pay

## 2018-08-29 DIAGNOSIS — Z86010 Personal history of colonic polyps: Secondary | ICD-10-CM | POA: Diagnosis not present

## 2018-08-29 DIAGNOSIS — I472 Ventricular tachycardia: Secondary | ICD-10-CM | POA: Diagnosis not present

## 2018-08-29 DIAGNOSIS — Z09 Encounter for follow-up examination after completed treatment for conditions other than malignant neoplasm: Secondary | ICD-10-CM | POA: Insufficient documentation

## 2018-08-29 DIAGNOSIS — D124 Benign neoplasm of descending colon: Secondary | ICD-10-CM | POA: Diagnosis not present

## 2018-08-29 DIAGNOSIS — K648 Other hemorrhoids: Secondary | ICD-10-CM | POA: Insufficient documentation

## 2018-08-29 DIAGNOSIS — D120 Benign neoplasm of cecum: Secondary | ICD-10-CM

## 2018-08-29 DIAGNOSIS — R1319 Other dysphagia: Secondary | ICD-10-CM

## 2018-08-29 DIAGNOSIS — F419 Anxiety disorder, unspecified: Secondary | ICD-10-CM | POA: Insufficient documentation

## 2018-08-29 DIAGNOSIS — K219 Gastro-esophageal reflux disease without esophagitis: Secondary | ICD-10-CM | POA: Diagnosis not present

## 2018-08-29 DIAGNOSIS — R1310 Dysphagia, unspecified: Secondary | ICD-10-CM | POA: Diagnosis not present

## 2018-08-29 HISTORY — PX: MALONEY DILATION: SHX5535

## 2018-08-29 HISTORY — PX: COLONOSCOPY WITH PROPOFOL: SHX5780

## 2018-08-29 HISTORY — PX: POLYPECTOMY: SHX5525

## 2018-08-29 HISTORY — PX: ESOPHAGOGASTRODUODENOSCOPY (EGD) WITH PROPOFOL: SHX5813

## 2018-08-29 SURGERY — COLONOSCOPY WITH PROPOFOL
Anesthesia: Monitor Anesthesia Care

## 2018-08-29 MED ORDER — PROPOFOL 10 MG/ML IV BOLUS
INTRAVENOUS | Status: AC
  Filled 2018-08-29: qty 60

## 2018-08-29 MED ORDER — PROMETHAZINE HCL 25 MG/ML IJ SOLN: 6.2500 mg | INTRAMUSCULAR | Status: DC | PRN

## 2018-08-29 MED ORDER — PROPOFOL 500 MG/50ML IV EMUL
INTRAVENOUS | Status: DC | PRN
  Administered 2018-08-29: 150 ug/kg/min via INTRAVENOUS
  Administered 2018-08-29: 10:00:00 via INTRAVENOUS

## 2018-08-29 MED ORDER — LACTATED RINGERS IV SOLN
INTRAVENOUS | Status: DC
  Administered 2018-08-29 (×2): via INTRAVENOUS

## 2018-08-29 MED ORDER — CHLORHEXIDINE GLUCONATE CLOTH 2 % EX PADS: 6.0000 | MEDICATED_PAD | Freq: Once | CUTANEOUS | Status: DC

## 2018-08-29 MED ORDER — HYDROMORPHONE HCL 1 MG/ML IJ SOLN: 0.2500 mg | INTRAMUSCULAR | Status: DC | PRN

## 2018-08-29 MED ORDER — HYDROCODONE-ACETAMINOPHEN 7.5-325 MG PO TABS: 1.0000 | ORAL_TABLET | Freq: Once | ORAL | Status: DC | PRN

## 2018-08-29 MED ORDER — LIDOCAINE HCL (CARDIAC) PF 50 MG/5ML IV SOSY
PREFILLED_SYRINGE | INTRAVENOUS | Status: DC | PRN
  Administered 2018-08-29: 40 mg via INTRAVENOUS

## 2018-08-29 MED ORDER — MEPERIDINE HCL 100 MG/ML IJ SOLN: 6.2500 mg | INTRAMUSCULAR | Status: DC | PRN

## 2018-08-29 MED ORDER — GLYCOPYRROLATE 0.2 MG/ML IJ SOLN
INTRAMUSCULAR | Status: DC | PRN
  Administered 2018-08-29: 0.2 mg via INTRAVENOUS

## 2018-08-29 MED ORDER — PROPOFOL 10 MG/ML IV BOLUS
INTRAVENOUS | Status: DC | PRN
  Administered 2018-08-29 (×2): 40 mg via INTRAVENOUS

## 2018-08-29 MED ORDER — LACTATED RINGERS IV SOLN: INTRAVENOUS | Status: DC

## 2018-08-29 NOTE — Op Note (Signed)
[facility] Patient Name: Warren Barr Procedure Date: 08/29/2018 9:22 AM MRN: [id number] Date of Birth: [birth year] Attending MD: Norvel Richards , MD CSN: [id number] Age: 52 Admit Type: Outpatient Procedure:                Upper GI endoscopy Indications:              Dysphagia Providers:                Norvel Richards, MD, Lurline Del, RN, Randa Spike, Technician Referring MD:              Medicines:                Propofol per Anesthesia Complications:            No immediate complications. Estimated Blood Loss:     Estimated blood loss: none. Procedure:                Pre-Anesthesia Assessment:                           - Prior to the procedure, a History and Physical                            was performed, and patient medications and                            allergies were reviewed. The patient's tolerance of                            previous anesthesia was also reviewed. The risks                            and benefits of the procedure and the sedation                            options and risks were discussed with the patient.                            All questions were answered, and informed consent                            was obtained. Prior Anticoagulants: The patient has                            taken no previous anticoagulant or antiplatelet                            agents. ASA Grade Assessment: II - A patient with                            mild systemic disease. After reviewing the risks  and benefits, the patient was deemed in                            satisfactory condition to undergo the procedure.                           After obtaining informed consent, the endoscope was                            passed under direct vision. Throughout the                            procedure, the patient's blood pressure, pulse, and                            oxygen saturations were monitored  continuously. The                            [id number]-H190 ([id number]) scope was introduced through the                            and advanced to the second part of duodenum. The                            upper GI endoscopy was accomplished without                            difficulty. The patient tolerated the procedure                            well. Scope In: 9:37:01 AM Scope Out: 9:45:29 AM Total Procedure Duration: 0 hours 8 minutes 28 seconds  Findings:      The examined esophagus was normal.      The entire examined stomach was normal.      The duodenal bulb and second portion of the duodenum were normal. The       scope was withdrawn. Dilation was performed with a Maloney dilator with       no resistance at 56 Fr. The dilation site was examined following       endoscope reinsertion and showed no change. Estimated blood loss: none. Impression:               - Normal esophagus. Dilated.                           - Normal stomach.                           - Normal duodenal bulb and second portion of the                            duodenum.                           - No specimens collected. Moderate Sedation:      Moderate (conscious) sedation was personally administered by  an       anesthesia professional. The following parameters were monitored: oxygen       saturation, heart rate, blood pressure, respiratory rate, EKG, adequacy       of pulmonary ventilation, and response to care. Total physician       intraservice time was 17 minutes. Recommendation:           - Patient has a contact number available for                            emergencies. The signs and symptoms of potential                            delayed complications were discussed with the                            patient. Return to normal activities tomorrow.                            Written discharge instructions were provided to the                            patient.                           - Resume  previous diet.                           - Continue present medications. Stop Protonix;                            trial of Nexium 40 mg daily.                           - No repeat upper endoscopy.                           - Return to GI office in 2 months. See colonoscopy                            report. Procedure Code(s):        --- Professional ---                           [phone], Esophagogastroduodenoscopy, flexible,                            transoral; diagnostic, including collection of                            specimen(s) by brushing or washing, when performed                            (separate procedure)                           [id number], Dilation of esophagus, by unguided sound or  bougie, single or multiple passes Diagnosis Code(s):        --- Professional ---                           R13.10, Dysphagia, unspecified CPT copyright 2017 [employer]. All rights reserved. The codes documented in this report are preliminary and upon coder review may  be revised to meet current compliance requirements. Warren Barr. Warren England, MD Norvel Richards, MD 08/29/2018 10:07:24 AM This report has been signed electronically. Number of Addenda: 0

## 2018-08-29 NOTE — Op Note (Signed)
[facility] Patient Name: Warren Barr Procedure Date: 08/29/2018 9:48 AM MRN: [id number] Date of Birth: 03/20/1966 Attending MD: Norvel Richards , MD CSN: [id number] Age: 52 Admit Type: Outpatient Procedure:                Colonoscopy Indications:              High risk colon cancer surveillance: Personal                            history of colonic polyps Providers:                Norvel Richards, MD, Lurline Del, RN, Randa Spike, Technician Referring MD:              Medicines:                Propofol per Anesthesia Complications:            No immediate complications. Estimated Blood Loss:     Estimated blood loss was minimal. Procedure:                Pre-Anesthesia Assessment:                           - Prior to the procedure, a History and Physical                            was performed, and patient medications and                            allergies were reviewed. The patient's tolerance of                            previous anesthesia was also reviewed. The risks                            and benefits of the procedure and the sedation                            options and risks were discussed with the patient.                            All questions were answered, and informed consent                            was obtained. Prior Anticoagulants: The patient has                            taken no previous anticoagulant or antiplatelet                            agents. ASA Grade Assessment: II - A patient with  mild systemic disease. After reviewing the risks                            and benefits, the patient was deemed in                            satisfactory condition to undergo the procedure.                           After obtaining informed consent, the colonoscope                            was passed under direct vision. Throughout the                            procedure, the  patient's blood pressure, pulse, and                            oxygen saturations were monitored continuously. The                            CF-HQ190L ([id number]) scope was introduced through                            the anus and advanced to the the cecum, identified                            by appendiceal orifice and ileocecal valve. The                            colonoscopy was performed without difficulty. The                            patient tolerated the procedure well. The quality                            of the bowel preparation was adequate. Scope In: 9:52:27 AM Scope Out: 50:09:38 AM Scope Withdrawal Time: 0 hours 11 minutes 11 seconds  Total Procedure Duration: 0 hours 13 minutes 49 seconds  Findings:      Two sessile polyps were found in the descending colon and cecum. The       polyps were 6 to 7 mm in size. These polyps were removed with a cold       snare. Resection and retrieval were complete. Estimated blood loss was       minimal.      Non-bleeding internal hemorrhoids were found during retroflexion. Impression:               - Two 6 to 7 mm polyps in the descending colon and                            in the cecum, removed with a cold snare. Resected  and retrieved.                           - Non-bleeding internal hemorrhoids. Moderate Sedation:      Moderate (conscious) sedation was personally administered by an       anesthesia professional. The following parameters were monitored: oxygen       saturation, heart rate, blood pressure, respiratory rate, EKG, adequacy       of pulmonary ventilation, and response to care. Total physician       intraservice time was 38 minutes. Recommendation:           - Patient has a contact number available for                            emergencies. The signs and symptoms of potential                            delayed complications were discussed with the                            patient. Return  to normal activities tomorrow.                            Written discharge instructions were provided to the                            patient.                           - Advance diet as tolerated.                           - Patient has a contact number available for                            emergencies. The signs and symptoms of potential                            delayed complications were discussed with the                            patient. Return to normal activities tomorrow.                            Written discharge instructions were provided to the                            patient.                           - Advance diet as tolerated.                           - Continue present medications.                           - Repeat  colonoscopy date to be determined after                            pending pathology results are reviewed for                            surveillance based on pathology results.                           - Return to GI office in 2 months. See EGD report. Procedure Code(s):        --- Professional ---                           [phone], Colonoscopy, flexible; with removal of                            tumor(s), polyp(s), or other lesion(s) by snare                            technique Diagnosis Code(s):        --- Professional ---                           Z86.010, Personal history of colonic polyps                           D12.4, Benign neoplasm of descending colon                           D12.0, Benign neoplasm of cecum                           K64.8, Other hemorrhoids CPT copyright 2017 American [employer] Association. All rights reserved. The codes documented in this report are preliminary and upon coder review may  be revised to meet current compliance requirements. [name]. Briannah Lona, MD Norvel Richards, MD 08/29/2018 10:13:55 AM This report has been signed electronically. Number of Addenda: 0

## 2018-08-29 NOTE — Transfer of Care (Signed)
Immediate Anesthesia Transfer of Care Note  Patient: Warren Barr  Procedure(s) Performed: COLONOSCOPY WITH PROPOFOL (N/A ) ESOPHAGOGASTRODUODENOSCOPY (EGD) WITH PROPOFOL (N/A ) MALONEY DILATION (N/A ) POLYPECTOMY  Patient Location: PACU  Anesthesia Type:MAC  Level of Consciousness: awake, alert , oriented and patient cooperative  Airway & Oxygen Therapy: Patient Spontanous Breathing  Post-op Assessment: Report given to RN and Post -op Vital signs reviewed and stable  Post vital signs: Reviewed and stable  Last Vitals:  Vitals Value Taken Time  BP 152/128 08/29/2018 10:15 AM  Temp    Pulse 86 08/29/2018 10:15 AM  Resp 16 08/29/2018 10:15 AM  SpO2 97 % 08/29/2018 10:15 AM  Vitals shown include unvalidated device data.  Last Pain:  Vitals:   08/29/18 0933  TempSrc:   PainSc: 0-No pain      Patients Stated Pain Goal: 5 (18/86/77 3736)  Complications: No apparent anesthesia complications

## 2018-08-29 NOTE — Anesthesia Preprocedure Evaluation (Signed)
Anesthesia Evaluation  Patient identified by MRN, date of birth, ID band Patient awake    Reviewed: Allergy & Precautions, H&P , NPO status , Patient's Chart, lab work & pertinent test results, reviewed documented beta blocker date and time   Airway Mallampati: II  TM Distance: >3 FB Neck ROM: full    Dental no notable dental hx. (+) Teeth Intact   Pulmonary neg pulmonary ROS,    Pulmonary exam normal breath sounds clear to auscultation       Cardiovascular Exercise Tolerance: Good negative cardio ROS  + dysrhythmias Ventricular Tachycardia  Rhythm:regular Rate:Normal     Neuro/Psych PSYCHIATRIC DISORDERS Anxiety negative neurological ROS  negative psych ROS   GI/Hepatic negative GI ROS, Neg liver ROS, GERD  Medicated,  Endo/Other  negative endocrine ROS  Renal/GU negative Renal ROS  negative genitourinary   Musculoskeletal   Abdominal   Peds  Hematology negative hematology ROS (+)   Anesthesia Other Findings Vtach hx controlled with cardizem Borderline HTN  Reproductive/Obstetrics negative OB ROS                             Anesthesia Physical Anesthesia Plan  ASA: II  Anesthesia Plan: MAC   Post-op Pain Management:    Induction:   PONV Risk Score and Plan:   Airway Management Planned:   Additional Equipment:   Intra-op Plan:   Post-operative Plan:   Informed Consent: I have reviewed the patients History and Physical, chart, labs and discussed the procedure including the risks, benefits and alternatives for the proposed anesthesia with the patient or authorized representative who has indicated his/her understanding and acceptance.   Dental Advisory Given  Plan Discussed with: CRNA and Anesthesiologist  Anesthesia Plan Comments:         Anesthesia Quick Evaluation

## 2018-08-29 NOTE — Interval H&P Note (Signed)
History and Physical Interval Note:  08/29/2018 9:24 AM  [name]  has presented today for surgery, with the diagnosis of GERD, dysphagia, hx polyps  The various methods of treatment have been discussed with the patient and family. After consideration of risks, benefits and other options for treatment, the patient has consented to  Procedure(s) with comments: COLONOSCOPY WITH PROPOFOL (N/A) - 9:15am ESOPHAGOGASTRODUODENOSCOPY (EGD) WITH PROPOFOL (N/A) MALONEY DILATION (N/A) as a surgical intervention .  The patient's history has been reviewed, patient examined, no change in status, stable for surgery.  I have reviewed the patient's chart and labs.  Questions were answered to the patient's satisfaction.     Aslin Farinas  No change.  EGD with ED and surveillance colonoscopy per plan.  The risks, benefits, limitations, imponderables and alternatives regarding both EGD and colonoscopy have been reviewed with the patient. Questions have been answered. All parties agreeable.

## 2018-08-29 NOTE — Discharge Instructions (Signed)
EGD Discharge instructions Please read the instructions outlined below and refer to this sheet in the next few weeks. These discharge instructions provide you with general information on caring for yourself after you leave the hospital. Your doctor may also give you specific instructions. While your treatment has been planned according to the most current medical practices available, unavoidable complications occasionally occur. If you have any problems or questions after discharge, please call your doctor. ACTIVITY  You may resume your regular activity but move at a slower pace for the next 24 hours.   Take frequent rest periods for the next 24 hours.   Walking will help expel (get rid of) the air and reduce the bloated feeling in your abdomen.   No driving for 24 hours (because of the anesthesia (medicine) used during the test).   You may shower.   Do not sign any important legal documents or operate any machinery for 24 hours (because of the anesthesia used during the test).  NUTRITION  Drink plenty of fluids.   You may resume your normal diet.   Begin with a light meal and progress to your normal diet.   Avoid alcoholic beverages for 24 hours or as instructed by your caregiver.  MEDICATIONS  You may resume your normal medications unless your caregiver tells you otherwise.  WHAT YOU CAN EXPECT TODAY  You may experience abdominal discomfort such as a feeling of fullness or gas pains.  FOLLOW-UP  Your doctor will discuss the results of your test with you.  SEEK IMMEDIATE MEDICAL ATTENTION IF ANY OF THE FOLLOWING OCCUR:  Excessive nausea (feeling sick to your stomach) and/or vomiting.   Severe abdominal pain and distention (swelling).   Trouble swallowing.   Temperature over 101 F (37.8 C).   Rectal bleeding or vomiting of blood.   Colonoscopy Discharge Instructions  Read the instructions outlined below and refer to this sheet in the next few weeks. These  discharge instructions provide you with general information on caring for yourself after you leave the hospital. Your doctor may also give you specific instructions. While your treatment has been planned according to the most current medical practices available, unavoidable complications occasionally occur. If you have any problems or questions after discharge, call Dr. Gala Romney at [phone]. ACTIVITY  You may resume your regular activity, but move at a slower pace for the next 24 hours.   Take frequent rest periods for the next 24 hours.   Walking will help get rid of the air and reduce the bloated feeling in your belly (abdomen).   No driving for 24 hours (because of the medicine (anesthesia) used during the test).    Do not sign any important legal documents or operate any machinery for 24 hours (because of the anesthesia used during the test).  NUTRITION  Drink plenty of fluids.   You may resume your normal diet as instructed by your doctor.   Begin with a light meal and progress to your normal diet. Heavy or fried foods are harder to digest and may make you feel sick to your stomach (nauseated).   Avoid alcoholic beverages for 24 hours or as instructed.  MEDICATIONS  You may resume your normal medications unless your doctor tells you otherwise.  WHAT YOU CAN EXPECT TODAY  Some feelings of bloating in the abdomen.   Passage of more gas than usual.   Spotting of blood in your stool or on the toilet paper.  IF YOU HAD POLYPS REMOVED DURING THE COLONOSCOPY:  No aspirin products for 7 days or as instructed.   No alcohol for 7 days or as instructed.   Eat a soft diet for the next 24 hours.  FINDING OUT THE RESULTS OF YOUR TEST Not all test results are available during your visit. If your test results are not back during the visit, make an appointment with your caregiver to find out the results. Do not assume everything is normal if you have not heard from your caregiver or the  medical facility. It is important for you to follow up on all of your test results.  SEEK IMMEDIATE MEDICAL ATTENTION IF:  You have more than a spotting of blood in your stool.   Your belly is swollen (abdominal distention).   You are nauseated or vomiting.   You have a temperature over 101.   You have abdominal pain or discomfort that is severe or gets worse throughout the day.    GERD and colon polyp information provided  Hemorrhoid information provided  Stop Protonix; begin Nexium 40 mg daily  Further recommendations to follow pending review of pathology report  Office visit with [place] in 8 weeks    Gastroesophageal Reflux Disease, Adult Normally, food travels down the esophagus and stays in the stomach to be digested. If a person has gastroesophageal reflux disease (GERD), food and stomach acid move back up into the esophagus. When this happens, the esophagus becomes sore and swollen (inflamed). Over time, GERD can make small holes (ulcers) in the lining of the esophagus. Follow these instructions at home: Diet  Follow a diet as told by your doctor. You may need to avoid foods and drinks such as: ? Coffee and tea (with or without caffeine). ? Drinks that contain alcohol. ? Energy drinks and sports drinks. ? Carbonated drinks or sodas. ? Chocolate and cocoa. ? Peppermint and mint flavorings. ? Garlic and onions. ? Horseradish. ? Spicy and acidic foods, such as peppers, chili powder, curry powder, vinegar, hot sauces, and BBQ sauce. ? Citrus fruit juices and citrus fruits, such as oranges, lemons, and limes. ? Tomato-based foods, such as red sauce, chili, salsa, and pizza with red sauce. ? Fried and fatty foods, such as donuts, french fries, potato chips, and high-fat dressings. ? High-fat meats, such as hot dogs, rib eye steak, sausage, ham, and bacon. ? High-fat dairy items, such as whole milk, butter, and cream cheese.  Eat small meals often. Avoid eating large  meals.  Avoid drinking large amounts of liquid with your meals.  Avoid eating meals during the 2-3 hours before bedtime.  Avoid lying down right after you eat.  Do not exercise right after you eat. General instructions  Pay attention to any changes in your symptoms.  Take over-the-counter and prescription medicines only as told by your doctor. Do not take aspirin, ibuprofen, or other NSAIDs unless your doctor says it is okay.  Do not use any tobacco products, including cigarettes, chewing tobacco, and e-cigarettes. If you need help quitting, ask your doctor.  Wear loose clothes. Do not wear anything tight around your waist.  Raise (elevate) the head of your bed about 6 inches (15 cm).  Try to lower your stress. If you need help doing this, ask your doctor.  If you are overweight, lose an amount of weight that is healthy for you. Ask your doctor about a safe weight loss goal.  Keep all follow-up visits as told by your doctor. This is important. Contact a doctor if:  You have  new symptoms.  You lose weight and you do not know why it is happening.  You have trouble swallowing, or it hurts to swallow.  You have wheezing or a cough that keeps happening.  Your symptoms do not get better with treatment.  You have a hoarse voice. Get help right away if:  You have pain in your arms, neck, jaw, teeth, or back.  You feel sweaty, dizzy, or light-headed.  You have chest pain or shortness of breath.  You throw up (vomit) and your throw up looks like blood or coffee grounds.  You pass out (faint).  Your poop (stool) is bloody or black.  You cannot swallow, drink, or eat. This information is not intended to replace advice given to you by your health care provider. Make sure you discuss any questions you have with your health care provider. Document Released: 05/01/2008 Document Revised: 04/20/2016 Document Reviewed: 03/10/2015 Elsevier Interactive Patient Education  2018  [employer].     Colon Polyps Polyps are tissue growths inside the body. Polyps can grow in many places, including the large intestine (colon). A polyp may be a round bump or a mushroom-shaped growth. You could have one polyp or several. Most colon polyps are noncancerous (benign). However, some colon polyps can become cancerous over time. What are the causes? The exact cause of colon polyps is not known. What increases the risk? This condition is more likely to develop in people who:  Have a family history of colon cancer or colon polyps.  Are older than 82 or older than 45 if they are African American.  Have inflammatory bowel disease, such as ulcerative colitis or Crohn disease.  Are overweight.  Smoke cigarettes.  Do not get enough exercise.  Drink too much alcohol.  Eat a diet that is: ? High in fat and red meat. ? Low in fiber.  Had childhood cancer that was treated with abdominal radiation.  What are the signs or symptoms? Most polyps do not cause symptoms. If you have symptoms, they may include:  Blood coming from your rectum when having a bowel movement.  Blood in your stool.The stool may look dark red or black.  A change in bowel habits, such as constipation or diarrhea.  How is this diagnosed? This condition is diagnosed with a colonoscopy. This is a procedure that uses a lighted, flexible scope to look at the inside of your colon. How is this treated? Treatment for this condition involves removing any polyps that are found. Those polyps will then be tested for cancer. If cancer is found, your health care provider will talk to you about options for colon cancer treatment. Follow these instructions at home: Diet  Eat plenty of fiber, such as fruits, vegetables, and whole grains.  Eat foods that are high in calcium and vitamin D, such as milk, cheese, yogurt, eggs, liver, fish, and broccoli.  Limit foods high in fat, red meats, and processed meats, such  as hot dogs, sausage, bacon, and lunch meats.  Maintain a healthy weight, or lose weight if recommended by your health care provider. General instructions  Do not smoke cigarettes.  Do not drink alcohol excessively.  Keep all follow-up visits as told by your health care provider. This is important. This includes keeping regularly scheduled colonoscopies. Talk to your health care provider about when you need a colonoscopy.  Exercise every day or as told by your health care provider. Contact a health care provider if:  You have new or worsening  bleeding during a bowel movement.  You have new or increased blood in your stool.  You have a change in bowel habits.  You unexpectedly lose weight. This information is not intended to replace advice given to you by your health care provider. Make sure you discuss any questions you have with your health care provider. Document Released: 08/09/2004 Document Revised: 04/20/2016 Document Reviewed: 10/04/2015 Elsevier Interactive Patient Education  2018 [employer].     Hemorrhoids Hemorrhoids are swollen veins in and around the rectum or anus. There are two types of hemorrhoids:  Internal hemorrhoids. These occur in the veins that are just inside the rectum. They may poke through to the outside and become irritated and painful.  External hemorrhoids. These occur in the veins that are outside of the anus and can be felt as a painful swelling or hard lump near the anus.  Most hemorrhoids do not cause serious problems, and they can be managed with home treatments such as diet and lifestyle changes. If home treatments do not help your symptoms, procedures can be done to shrink or remove the hemorrhoids. What are the causes? This condition is caused by increased pressure in the anal area. This pressure may result from various things, including:  Constipation.  Straining to have a bowel  movement.  Diarrhea.  Pregnancy.  Obesity.  Sitting for long periods of time.  Heavy lifting or other activity that causes you to strain.  Anal sex.  What are the signs or symptoms? Symptoms of this condition include:  Pain.  Anal itching or irritation.  Rectal bleeding.  Leakage of stool (feces).  Anal swelling.  One or more lumps around the anus.  How is this diagnosed? This condition can often be diagnosed through a visual exam. Other exams or tests may also be done, such as:  Examination of the rectal area with a gloved hand (digital rectal exam).  Examination of the anal canal using a small tube (anoscope).  A blood test, if you have lost a significant amount of blood.  A test to look inside the colon (sigmoidoscopy or colonoscopy).  How is this treated? This condition can usually be treated at home. However, various procedures may be done if dietary changes, lifestyle changes, and other home treatments do not help your symptoms. These procedures can help make the hemorrhoids smaller or remove them completely. Some of these procedures involve surgery, and others do not. Common procedures include:  Rubber band ligation. Rubber bands are placed at the base of the hemorrhoids to cut off the blood supply to them.  Sclerotherapy. Medicine is injected into the hemorrhoids to shrink them.  Infrared coagulation. A type of light energy is used to get rid of the hemorrhoids.  Hemorrhoidectomy surgery. The hemorrhoids are surgically removed, and the veins that supply them are tied off.  Stapled hemorrhoidopexy surgery. A circular stapling device is used to remove the hemorrhoids and use staples to cut off the blood supply to them.  Follow these instructions at home: Eating and drinking  Eat foods that have a lot of fiber in them, such as whole grains, beans, nuts, fruits, and vegetables. Ask your health care provider about taking products that have added fiber (fiber  supplements).  Drink enough fluid to keep your urine clear or pale yellow. Managing pain and swelling  Take warm sitz baths for 20 minutes, 3-4 times a day to ease pain and discomfort.  If directed, apply ice to the affected area. Using ice packs between  sitz baths may be helpful. ? Put ice in a plastic bag. ? Place a towel between your skin and the bag. ? Leave the ice on for 20 minutes, 2-3 times a day. General instructions  Take over-the-counter and prescription medicines only as told by your health care provider.  Use medicated creams or suppositories as told.  Exercise regularly.  Go to the bathroom when you have the urge to have a bowel movement. Do not wait.  Avoid straining to have bowel movements.  Keep the anal area dry and clean. Use wet toilet paper or moist towelettes after a bowel movement.  Do not sit on the toilet for long periods of time. This increases blood pooling and pain. Contact a health care provider if:  You have increasing pain and swelling that are not controlled by treatment or medicine.  You have uncontrolled bleeding.  You have difficulty having a bowel movement, or you are unable to have a bowel movement.  You have pain or inflammation outside the area of the hemorrhoids. This information is not intended to replace advice given to you by your health care provider. Make sure you discuss any questions you have with your health care provider. Document Released: 11/10/2000 Document Revised: 04/12/2016 Document Reviewed: 07/28/2015 Elsevier Interactive Patient Education  2018 [place], Care After These instructions provide you with information about caring for yourself after your procedure. Your health care provider may also give you more specific instructions. Your treatment has been planned according to current medical practices, but problems sometimes occur. Call your health care provider if you have any  problems or questions after your procedure. What can I expect after the procedure? After your procedure, it is common to: Feel sleepy for several hours. Feel clumsy and have poor balance for several hours. Feel forgetful about what happened after the procedure. Have poor judgment for several hours. Feel nauseous or vomit. Have a sore throat if you had a breathing tube during the procedure.  Follow these instructions at home: For at least 24 hours after the procedure:  Do not: Participate in activities in which you could fall or become injured. Drive. Use heavy machinery. Drink alcohol. Take sleeping pills or medicines that cause drowsiness. Make important decisions or sign legal documents. Take care of children on your own. Rest. Eating and drinking Follow the diet that is recommended by your health care provider. If you vomit, drink water, juice, or soup when you can drink without vomiting. Make sure you have little or no nausea before eating solid foods. General instructions Have a responsible adult stay with you until you are awake and alert. Take over-the-counter and prescription medicines only as told by your health care provider. If you smoke, do not smoke without supervision. Keep all follow-up visits as told by your health care provider. This is important. Contact a health care provider if: You keep feeling nauseous or you keep vomiting. You feel light-headed. You develop a rash. You have a fever. Get help right away if: You have trouble breathing. This information is not intended to replace advice given to you by your health care provider. Make sure you discuss any questions you have with your health care provider. Document Released: 03/05/2016 Document Revised: 07/05/2016 Document Reviewed: 03/05/2016 Elsevier Interactive Patient Education  [employer].

## 2018-08-29 NOTE — Anesthesia Postprocedure Evaluation (Signed)
Anesthesia Post Note  Patient: BALEN WOOLUM  Procedure(s) Performed: COLONOSCOPY WITH PROPOFOL (N/A ) ESOPHAGOGASTRODUODENOSCOPY (EGD) WITH PROPOFOL (N/A ) MALONEY DILATION (N/A ) POLYPECTOMY  Patient location during evaluation: PACU Anesthesia Type: MAC Level of consciousness: awake and alert and oriented Pain management: pain level controlled Vital Signs Assessment: post-procedure vital signs reviewed and stable Respiratory status: spontaneous breathing Cardiovascular status: stable Postop Assessment: no apparent nausea or vomiting Anesthetic complications: no     Last Vitals:  Vitals:   08/29/18 0816  BP: 134/89  Pulse: 74  Resp: 14  Temp: 36.5 C  SpO2: 98%    Last Pain:  Vitals:   08/29/18 0933  TempSrc:   PainSc: 0-No pain                 ADAMS, AMY A

## 2018-08-29 NOTE — Anesthesia Procedure Notes (Signed)
Procedure Name: Trent Performed by: Andree Elk Brookelynn Hamor A, CRNA Pre-anesthesia Checklist: Patient identified, Emergency Drugs available, Suction available, Patient being monitored and Timeout performed Oxygen Delivery Method: Simple face mask

## 2018-09-01 ENCOUNTER — Encounter: Payer: Self-pay | Admitting: Internal Medicine

## 2018-09-03 ENCOUNTER — Encounter (HOSPITAL_COMMUNITY): Payer: Self-pay | Admitting: Internal Medicine

## 2018-09-12 ENCOUNTER — Telehealth: Payer: Self-pay | Admitting: Internal Medicine

## 2018-09-12 ENCOUNTER — Other Ambulatory Visit: Payer: Self-pay

## 2018-09-12 DIAGNOSIS — R1310 Dysphagia, unspecified: Secondary | ICD-10-CM

## 2018-09-12 DIAGNOSIS — C4492 Squamous cell carcinoma of skin, unspecified: Secondary | ICD-10-CM

## 2018-09-12 DIAGNOSIS — R1319 Other dysphagia: Secondary | ICD-10-CM

## 2018-09-12 HISTORY — DX: Squamous cell carcinoma of skin, unspecified: C44.92

## 2018-09-12 NOTE — Telephone Encounter (Signed)
Pt had TCS and EGD done by [facility] on OCT 3 and he said that he is still having difficulty swallowing. He said it was better than before, but it was still concerning him. Please advise. [phone]

## 2018-09-12 NOTE — Telephone Encounter (Signed)
Spoke with pt. He states that his symptoms are better than what they were, but he noticed later on in the day that he has some swallowing issues. He has to chew his food a lot longer. He started taking Nexium once in the morning daily since his procedure on 08/29/18 as directed. Pt reports no pain.

## 2018-09-12 NOTE — Telephone Encounter (Signed)
I put in for a BPE that he can have done to check for other causes for his dysphagia. Please send to MB or MS for scheduling.

## 2018-09-13 NOTE — Telephone Encounter (Signed)
Test scheduled for 09/20/18 at 8:30am, arrival time 8:15am at [facility], NPO after midnight.   Called patient-LMOVM.

## 2018-09-13 NOTE — Telephone Encounter (Signed)
Left detailed message letting pt know the procedure EG recommends and our office will contact him when ready to schedule.

## 2018-09-13 NOTE — Addendum Note (Signed)
Addended by: Inge Rise on: 09/13/2018 09:43 AM   Modules accepted: Orders

## 2018-09-16 NOTE — Telephone Encounter (Signed)
Left detail message on cell phone (okay per dpr) regarding appt details. Also left # for central scheduling on VM to call and r/s if he can not make this appointment

## 2018-09-20 ENCOUNTER — Ambulatory Visit (HOSPITAL_COMMUNITY): Payer: BC Managed Care – PPO

## 2018-09-23 ENCOUNTER — Ambulatory Visit (HOSPITAL_COMMUNITY)
Admission: RE | Admit: 2018-09-23 | Discharge: 2018-09-23 | Disposition: A | Discharge status: Home / Self Care | Payer: BC Managed Care – PPO | Source: Ambulatory Visit | Attending: Nurse Practitioner | Admitting: Nurse Practitioner

## 2018-09-23 DIAGNOSIS — R1310 Dysphagia, unspecified: Secondary | ICD-10-CM | POA: Insufficient documentation

## 2018-09-23 DIAGNOSIS — R1319 Other dysphagia: Secondary | ICD-10-CM

## 2018-09-24 ENCOUNTER — Encounter: Payer: Self-pay | Admitting: Allergy & Immunology

## 2018-09-24 ENCOUNTER — Ambulatory Visit: Payer: BC Managed Care – PPO | Admitting: Allergy & Immunology

## 2018-09-24 VITALS — BP 124/82 | HR 75 | Temp 98.3°F | Resp 18 | Ht 73.25 in | Wt 209.8 lb

## 2018-09-24 DIAGNOSIS — J302 Other seasonal allergic rhinitis: Secondary | ICD-10-CM | POA: Diagnosis not present

## 2018-09-24 DIAGNOSIS — J3089 Other allergic rhinitis: Secondary | ICD-10-CM

## 2018-09-24 DIAGNOSIS — T781XXD Other adverse food reactions, not elsewhere classified, subsequent encounter: Secondary | ICD-10-CM

## 2018-09-24 DIAGNOSIS — R1319 Other dysphagia: Secondary | ICD-10-CM | POA: Diagnosis not present

## 2018-09-24 NOTE — Progress Notes (Signed)
NEW PATIENT  Date of Service/Encounter:  09/24/18  Referring provider: Tomasa Hose, NP   Assessment:   Seasonal and perennial allergic rhinitis  Dysphagia  Adverse food reaction  Plan/Recommendations:   1. Dysphagia - Testing was negative to the most common foods. - There is a the low positive predictive value of food allergy testing and hence the high possibility of false positives. - In contrast, food allergy testing has a high negative predictive value, therefore if testing is negative we can be relatively assured that they are indeed negative.  - Your symptoms sound like eosinophilic esophagitis, but we cannot diagnose that until we get biopsy results from your GI doctor. - Information on EoE provided, although again we cannot diagnose you with that until we get those biopsy results.  2. Seasonal allergic rhinitis - Testing today showed: ragweed, outdoor molds and dust mites - Avoidance measures provided. - Continue with: Zyrtec (cetirizine) 10mg  tablet once daily - You can use an extra dose of the antihistamine, if needed, for breakthrough symptoms.  - Consider nasal saline rinses 1-2 times daily to remove allergens from the nasal cavities as well as help with mucous clearance (this is especially helpful to do before the nasal sprays are given) - I do not think that allergy shots are needed at this time.   3. Return in about 6 months (around 03/26/2019) or earlier if needed.  Subjective:   Warren Barr is a 52 y.o. male presenting today for evaluation of  Chief Complaint  Patient presents with  . Allergy Testing    food     Warren Barr has a history of the following: Patient Active Problem List   Diagnosis Date Noted  . Seasonal and perennial allergic rhinitis 09/24/2018  . GERD (gastroesophageal reflux disease) 08/22/2018  . Dysphagia 08/22/2018  . History of colonic polyps   . Rectal pain 11/18/2015  . Hematochezia 11/18/2015  . PVC  (premature ventricular contraction) 03/28/2012  . Palpitations 02/26/2012  . Anxiety states     History obtained from: chart review and patient.  Warren Barr was referred by Warren Hose, NP.     Warren Barr is a 52 y.o. male presenting for an evaluation of possible food allergies.  He was evaluated by Dr. Gala Barr for dysphasia.  He underwent an endoscopy and colonoscopy earlier this month.  He did have an esophageal dilation.  It seems that pathology was ordered, but the results are pending.  He was never told by anyone that he had eosinophilic esophagitis.  He did have a few polyps in his colon removed, and pathology for these are pending as well.  In any case, he has had several years of this dysphasia.  He it Warren take him quite a long time to finish a meal.  He does require quite a bit of liquids to get the food to go down.  He does not notice that any of his symptoms worsen with any particular food.  His typical breakfast is two eggs, toast, bacon, and coffee, Typical lunch is leftovers. Supper is typically home cooked 90% of the time. He does eat peanuts, shellfish, rarely fish. He does eat wheat and soy and sesame.  He does not think that he has food allergies and reports that he is here "because Warren Barr thinks that I have food allergies".  He has never had any hives, breathing problems, abdominal pain, or swelling due to the food.  [name] is never been tested for  environmental allergies.  He does have postnasal drip and throat clearing during the fall.  He also had one episode in June when he was mowing his lawn and developed hacking cough due to the dust.  He went inside and changed his close, took a shower, and use nasal saline with improvement in his symptoms.  He Warren occasionally use an over-the-counter antihistamine, but has never been on a nose spray.  He does have a GI history significant for multiple colon polyps as well as hemorrhoids. Otherwise, there is no history of other  atopic diseases, including asthma, drug allergies, stinging insect allergies, eczema or urticaria. There is no significant infectious history. Vaccinations are up to date.    Past Medical History: Patient Active Problem List   Diagnosis Date Noted  . Seasonal and perennial allergic rhinitis 09/24/2018  . GERD (gastroesophageal reflux disease) 08/22/2018  . Dysphagia 08/22/2018  . History of colonic polyps   . Rectal pain 11/18/2015  . Hematochezia 11/18/2015  . PVC (premature ventricular contraction) 03/28/2012  . Palpitations 02/26/2012  . Anxiety states     Medication List:  Allergies as of 09/24/2018   No Known Allergies     Medication List        Accurate as of 09/24/18 12:56 PM. Always use your most recent med list.          diltiazem 120 MG 24 hr capsule Commonly known as:  CARDIZEM CD Take 1 capsule (120 mg total) by mouth daily.   esomeprazole 40 MG capsule Commonly known as:  NEXIUM Take 40 mg by mouth daily at 12 noon.       Birth History: non-contributory  Developmental History: non-contributory.   Past Surgical History: Past Surgical History:  Procedure Laterality Date  . COLONOSCOPY N/A 12/13/2015   Procedure: COLONOSCOPY;  Surgeon: Daneil Dolin, MD;  [facility];  Service: Endoscopy;  Laterality: N/A;  245 - moved to 1:45 - office to notify  . COLONOSCOPY WITH PROPOFOL N/A 08/29/2018   Procedure: COLONOSCOPY WITH PROPOFOL;  Surgeon: Daneil Dolin, MD;  [facility];  Service: Endoscopy;  Laterality: N/A;  9:15am  . ESOPHAGOGASTRODUODENOSCOPY (EGD) WITH PROPOFOL N/A 08/29/2018   Procedure: ESOPHAGOGASTRODUODENOSCOPY (EGD) WITH PROPOFOL;  Surgeon: Daneil Dolin, MD;  [facility];  Service: Endoscopy;  Laterality: N/A;  . Venia Minks DILATION N/A 08/29/2018   Procedure: Venia Minks DILATION;  Surgeon: Daneil Dolin, MD;  [facility];  Service: Endoscopy;  Laterality: N/A;  . Plantar fasciitis     Bilateral    . POLYPECTOMY  08/29/2018   Procedure: POLYPECTOMY;  Surgeon: Daneil Dolin, MD;  [facility];  Service: Endoscopy;;  cecal,descending  . VASECTOMY       Family History: Family History  Problem Relation Age of Onset  . Heart failure Father   . Colon cancer Neg Hx      Social History: Warren Barr lives at home with his wife and Passenger transport manager.  His daughter is 30 years old and is active on the crew team.  His son is 71 years old and is Chemical engineer in Conservation officer, nature at [employer].  They live in a house that is 50 years old.  There is hardwood throughout the home.  They have electric heating and central cooling.  There are no animals inside or outside of the home.  He does have dust mite covers on his bed, but not his pillows.  There is no tobacco exposure.  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  Otherwise, all other systems were negative. Constitutional: negative other than that listed in the HPI Eyes: negative other than that listed in the HPI Ears, nose, mouth, throat, and face: negative other than that listed in the HPI Respiratory: negative other than that listed in the HPI Cardiovascular: negative other than that listed in the HPI Gastrointestinal: negative other than that listed in the HPI Genitourinary: negative other than that listed in the HPI Integument: negative other than that listed in the HPI Hematologic: negative other than that listed in the HPI Musculoskeletal: negative other than that listed in the HPI Neurological: negative other than that listed in the HPI Allergy/Immunologic: negative other than that listed in the HPI    Objective:   Blood pressure 124/82, pulse 75, temperature 98.3 F (36.8 C), temperature source Oral, resp. rate 18, height 6' 1.25" (1.861 m),  weight 209 lb 12.8 oz (95.2 kg), SpO2 97 %. Body mass index is 27.49 kg/m.   Physical Exam:  General: Alert, interactive, in no acute distress. Pleasant male. Very interactive.  Eyes: No conjunctival injection bilaterally, no discharge on the right, no discharge on the left and no Horner-Trantas dots present. PERRL bilaterally. EOMI without pain. No photophobia.  Ears: Right TM pearly gray with normal light reflex, Left TM pearly gray with normal light reflex, Right TM intact without perforation and Left TM intact without perforation.  Nose/Throat: External nose within normal limits and septum midline. Turbinates edematous and pale with clear discharge. Posterior oropharynx erythematous with cobblestoning in the posterior oropharynx. Tonsils 2+ without exudates.  Tongue without thrush. Neck: Supple without thyromegaly. Trachea midline. Adenopathy: no enlarged lymph nodes appreciated in the anterior cervical, occipital, axillary, epitrochlear, inguinal, or popliteal regions. Lungs: Clear to auscultation without wheezing, rhonchi or rales. No increased work of breathing. CV: Normal S1/S2. No murmurs. Capillary refill <2 seconds.  Abdomen: Nondistended, nontender. No guarding or rebound tenderness. Bowel sounds present in all fields and hypoactive  Skin: Warm and dry, without lesions or rashes. Extremities:  No clubbing, cyanosis or edema. Neuro:   Grossly intact. No focal deficits appreciated. Responsive to questions.  Diagnostic studies:   Allergy Studies:   Airborne Adult Perc - October 18, 2018 0929    Time Antigen Placed  5852    Allergen Manufacturer  Lavella Hammock    Location  Back    Number of Test  59    Panel 1  Select    1. Control-Buffer 50% Glycerol  Negative    2. Control-Histamine 1 mg/ml  2+    3. Albumin saline  Negative    4. [place]  Negative    5. [place]  Negative    6. [place]  Negative    7. [place] Blue  Negative    8. Meadow Fescue  Negative    9. Perennial Rye  Negative     10. Sweet Vernal  Negative    11. Mare  Negative    12. Cocklebur  Negative    13. Burweed Marshelder  Negative    14. Ragweed, short  --   +/-   15. Ragweed, Giant  Negative    16. Plantain,  English  Negative    17. Lamb's Quarters  Negative    18. Sheep Sorrell  Negative  19. Rough Pigweed  Negative    20. Marsh Elder, Rough  Negative    21. Mugwort, Common  Negative    22. Ash mix  Negative    23. Birch mix  Negative    24. Beech American  Negative    25. Box, Elder  Negative    26. Cedar, red  Negative    27. Cottonwood, [place]  Negative    28. Elm mix  Negative    29. Hickory mix  Negative    30. Maple mix  Negative    31. Oak, [place] mix  Negative    32. Pecan Pollen  Negative    33. Pine mix  Negative    34. Sycamore Eastern  Negative    35. [place], Black Pollen  Negative    36. Alternaria alternata  2+    37. Cladosporium Herbarum  Negative    38. Aspergillus mix  Negative    39. Penicillium mix  Negative    40. Bipolaris sorokiniana (Helminthosporium)  Negative    41. Drechslera spicifera (Curvularia)  Negative    42. Mucor plumbeus  Negative    43. Fusarium moniliforme  Negative    44. Aureobasidium pullulans (pullulara)  Negative    45. Rhizopus oryzae  Negative    46. Botrytis cinera  Negative    47. Epicoccum nigrum  Negative    48. Phoma betae  Negative    49. Candida Albicans  Negative    50. Trichophyton mentagrophytes  Negative    51. Mite, D Farinae  5,000 AU/ml  3+    52. Mite, D Pteronyssinus  5,000 AU/ml  3+    53. Cat Hair 10,000 BAU/ml  Negative    54.  Dog Epithelia  Negative    55. Mixed Feathers  Negative    56. Horse Epithelia  Negative    57. Cockroach, German  Negative    58. Mouse  Negative    59. Tobacco Leaf  Negative    Other  Omitted    Other  Omitted     Food Adult Perc - 09/24/18 0930    Time Antigen Placed  1610    Allergen Manufacturer  Lavella Hammock    Location  Back    Number of allergen test  17     Control-buffer  50% Glycerol  Negative    Control-Histamine 1 mg/ml  2+    1. Peanut  Negative    2. Soybean  Negative    3. Wheat  Negative    4. Sesame  Negative    5. Milk, cow  Negative    6. Egg White, Chicken  Negative    7. Casein  Negative    8. Shellfish Mix  Negative    9. Fish Mix  Negative    10. Cashew  Negative    11. Pecan Food  Negative    12. [place]  Negative    13. Almond  Negative    14. Hazelnut  Negative    15. [place] nut  Negative    16. Coconut  Negative    17. Pistachio  Negative        Allergy testing results were read and interpreted by myself, documented by clinical staff.       Salvatore Marvel, MD Allergy and [place] [facility] [place]

## 2018-09-24 NOTE — Patient Instructions (Addendum)
1. Dysphagia - Testing was negative to the most common foods. - There is a the low positive predictive value of food allergy testing and hence the high possibility of false positives. - In contrast, food allergy testing has a high negative predictive value, therefore if testing is negative we can be relatively assured that they are indeed negative.  - Your symptoms sound like eosinophilic esophagitis, but we cannot diagnose that until we get biopsy results from your GI doctor. - Information on EoE provided, although again we cannot diagnose you with that until we get those biopsy results.  2. Seasonal allergic rhinitis - Testing today showed: ragweed, outdoor molds and dust mites - Avoidance measures provided. - Continue with: Zyrtec (cetirizine) 10mg  tablet once daily - You can use an extra dose of the antihistamine, if needed, for breakthrough symptoms.  - Consider nasal saline rinses 1-2 times daily to remove allergens from the nasal cavities as well as help with mucous clearance (this is especially helpful to do before the nasal sprays are given) - I do not think that allergy shots are needed at this time.   3. Return in about 6 months (around 03/26/2019) or earlier if needed.   Please inform us of any Emergency Department visits, hospitalizations, or changes in symptoms. Call us before going to the ED for breathing or allergy symptoms since we might be able to fit you in for a sick visit. Feel free to contact us anytime with any questions, problems, or concerns.  It was a pleasure to meet you today!  Websites that have reliable patient information: 1. American Academy of Asthma, Allergy, and Immunology: www.aaaai.org 2. Food Allergy Research and Education (FARE): foodallergy.org 3. Mothers of Asthmatics: http://www.asthmacommunitynetwork.org 4. American College of Allergy, Asthma, and Immunology: MonthlyElectricBill.co.uk   Make sure you are registered to vote! If you have moved or changed any  of your contact information, you will need to get this updated before voting!    Reducing Pollen Exposure  The American Academy of Allergy, Asthma and Immunology suggests the following steps to reduce your exposure to pollen during allergy seasons.    1. Do not hang sheets or clothing out to dry; pollen may collect on these items. 2. Do not mow lawns or spend time around freshly cut grass; mowing stirs up pollen. 3. Keep windows closed at night.  Keep car windows closed while driving. 4. Minimize morning activities outdoors, a time when pollen counts are usually at their highest. 5. Stay indoors as much as possible when pollen counts or humidity is high and on windy days when pollen tends to remain in the air longer. 6. Use air conditioning when possible.  Many air conditioners have filters that trap the pollen spores. 7. Use a HEPA room air filter to remove pollen form the indoor air you breathe.  Control of House Dust Mite Allergen    House dust mites play a major role in allergic asthma and rhinitis.  They occur in environments with high humidity wherever human skin, the food for dust mites is found. High levels have been detected in dust obtained from mattresses, pillows, carpets, upholstered furniture, bed covers, clothes and soft toys.  The principal allergen of the house dust mite is found in its feces.  A gram of dust may contain 1,000 mites and 250,000 fecal particles.  Mite antigen is easily measured in the air during house cleaning activities.    1. Encase mattresses, including the box spring, and pillow, in an air  tight cover.  Seal the zipper end of the encased mattresses with wide adhesive tape. 2. Wash the bedding in water of 130 degrees Farenheit weekly.  Avoid cotton comforters/quilts and flannel bedding: the most ideal bed covering is the dacron comforter. 3. Remove all upholstered furniture from the bedroom. 4. Remove carpets, carpet padding, rugs, and non-washable window  drapes from the bedroom.  Wash drapes weekly or use plastic window coverings. 5. Remove all non-washable stuffed toys from the bedroom.  Wash stuffed toys weekly. 6. Have the room cleaned frequently with a vacuum cleaner and a damp dust-mop.  The patient should not be in a room which is being cleaned and should wait 1 hour after cleaning before going into the room. 7. Close and seal all heating outlets in the bedroom.  Otherwise, the room will become filled with dust-laden air.  An electric heater can be used to heat the room. 8. Reduce indoor humidity to less than 50%.  Do not use a humidifier.  Control of Mold Allergen   Mold and fungi can grow on a variety of surfaces provided certain temperature and moisture conditions exist.  Outdoor molds grow on plants, decaying vegetation and soil.  The major outdoor mold, Alternaria and Cladosporium, are found in very high numbers during hot and dry conditions.  Generally, a late Summer - Fall peak is seen for common outdoor fungal spores.  Rain will temporarily lower outdoor mold spore count, but counts rise rapidly when the rainy period ends.  The most important indoor molds are Aspergillus and Penicillium.  Dark, humid and poorly ventilated basements are ideal sites for mold growth.  The next most common sites of mold growth are the bathroom and the kitchen.  Outdoor (Seasonal) Mold Control  Positive outdoor molds via skin testing: Alternaria  1. Use air conditioning and keep windows closedD 2. Avoid exposure to decaying vegetation. 3. Avoid leaf raking. 4. Avoid grain handling. 5. Consider wearing a face mask if working in moldy areas.

## 2018-09-30 ENCOUNTER — Telehealth: Payer: Self-pay

## 2018-09-30 NOTE — Telephone Encounter (Signed)
Patient is calling to get an update on if Dr Ernst Bowler has spoke with his gastro doctor or if he is waiting for the rest of his labs to come back in.  Please advise.

## 2018-10-01 NOTE — Telephone Encounter (Signed)
Left message for patient to return call.

## 2018-10-01 NOTE — Telephone Encounter (Signed)
Spoke to pt and he didn't schedule an appt yet because he had an upper esophagus scan last week and he's waiting on those results.

## 2018-10-01 NOTE — Telephone Encounter (Signed)
We did find his endoscopy results, but review of the procedure note shows that his esophagus looked normal and no biopsies were taken. Maybe he felt that EoE was unlikely during the endoscopy? When does he have a follow up with his GI doctor?    Warren Marvel, MD Allergy and [place] [facility] [place]

## 2018-10-03 NOTE — Telephone Encounter (Signed)
Great. I did send my note from his visit to Dr. Gala Romney, but I would recommend that he discuss the possibility of eosinophilic esophagitis with Dr. Gala Romney when he sees him again.   Warren Marvel, MD Allergy and [place] [facility] [place]

## 2018-10-07 ENCOUNTER — Telehealth: Payer: Self-pay | Admitting: Internal Medicine

## 2018-10-07 NOTE — Telephone Encounter (Signed)
PATIENT CALLED WANTING RESULTS FROM HIS UGI HE HAD A FEW WEEKS AGO

## 2018-10-07 NOTE — Telephone Encounter (Signed)
See result note.  

## 2018-10-07 NOTE — Telephone Encounter (Signed)
Spoke with pt and he is aware that I will contact him with results and recommendations from EG. Pt said that he just went to [facility] and got a copy of the report/disk and would like clarity of results due to not understanding the results. Please advise.

## 2018-10-07 NOTE — Telephone Encounter (Signed)
Noted  

## 2018-10-07 NOTE — Telephone Encounter (Signed)
Patient informed and advised. He did tell me that he has dropped of the esophageal scan. I let him know that Dr. Ernst Bowler was out of the office and would review them when he returns.

## 2018-10-07 NOTE — Telephone Encounter (Signed)
Lmom, waiting on a return call.  

## 2018-10-10 NOTE — Telephone Encounter (Signed)
Patient informed of this information and he did verbalize understanding.

## 2018-10-10 NOTE — Telephone Encounter (Signed)
Reviewed the results of the UGI. This is not diagnostic of EoE since we need biopsies. While I do see biopsies of colon polyps, I do not see any esophagus biopsies.  I would discuss his dysphagia (difficulty swallowing) with Dr. Gala Romney to see what he recommends regarding further evaluation.  Warren Marvel, MD Allergy and [place] [facility] [place]

## 2018-10-15 NOTE — Telephone Encounter (Signed)
I appreciate the feedback.  Findings recent EGD not at all consistent with a EOE.  Esophagus was not biopsied because it did not look like EOE and he tolerated a large bore dilation. We will certainly get him back to the office and review his current GI symptoms.  Thanks.

## 2018-10-16 NOTE — Telephone Encounter (Signed)
We will be in touch after he returns to the office

## 2018-10-16 NOTE — Telephone Encounter (Signed)
Awesome - thanks for weighing in, Dr. Gala Romney! I appreciate it immensely. Symptoms at this point do not sound like allergies.   Salvatore Marvel, MD Allergy and [place] [facility] [place]

## 2018-10-22 NOTE — Progress Notes (Signed)
Chief Complaint  Patient presents with  . Follow-up    PVC's   History of Present Illness: 52 yo male with history of chest pain and PVCs/PACs who is here today for cardiac follow up. I saw him as a new patient 02/26/12 for evaluation of chest pain and palpitations. I arranged an echo in 2013 which showed normal LV systolic function with LVEF of 60%. No valvular disease. His 48 hour Holter monitor showed NSR with 23,000 PVCs and 240 PACs. He was started on Cardizem. No testing since 2013. He has done well since then. He has had recent trouble with GERD.   He is here today for follow up. The patient denies any chest pain, dyspnea, palpitations, lower extremity edema, orthopnea, PND, dizziness, near syncope or syncope. He continues to have anxiety. He did not discuss this in primary care when seen there in Sept 2019.   Primary Care Physician: Nicholes Rough, PA-C  Past Medical History:  Diagnosis Date  . Anxiety states   . Dizziness   . PVC (premature ventricular contraction)     Past Surgical History:  Procedure Laterality Date  . COLONOSCOPY N/A 12/13/2015   Procedure: COLONOSCOPY;  Surgeon: Daneil Dolin, MD;  [facility];  Service: Endoscopy;  Laterality: N/A;  245 - moved to 1:45 - office to notify  . COLONOSCOPY WITH PROPOFOL N/A 08/29/2018   Procedure: COLONOSCOPY WITH PROPOFOL;  Surgeon: Daneil Dolin, MD;  [facility];  Service: Endoscopy;  Laterality: N/A;  9:15am  . ESOPHAGOGASTRODUODENOSCOPY (EGD) WITH PROPOFOL N/A 08/29/2018   Procedure: ESOPHAGOGASTRODUODENOSCOPY (EGD) WITH PROPOFOL;  Surgeon: Daneil Dolin, MD;  [facility];  Service: Endoscopy;  Laterality: N/A;  . Venia Minks DILATION N/A 08/29/2018   Procedure: Venia Minks DILATION;  Surgeon: Daneil Dolin, MD;  [facility];  Service: Endoscopy;  Laterality: N/A;  . Plantar fasciitis     Bilateral  . POLYPECTOMY  08/29/2018   Procedure: POLYPECTOMY;  Surgeon: Daneil Dolin,  MD;  [facility];  Service: Endoscopy;;  cecal,descending  . VASECTOMY      Current Outpatient Medications  Medication Sig Dispense Refill  . diltiazem (CARDIZEM CD) 120 MG 24 hr capsule Take 1 capsule (120 mg total) by mouth daily. 90 capsule 3  . esomeprazole (NEXIUM) 40 MG capsule Take 40 mg by mouth daily at 12 noon.    Marland Kitchen ALPRAZolam (XANAX) 0.25 MG tablet Take 1 tablet (0.25 mg total) by mouth 3 (three) times daily as needed for anxiety. 30 tablet 0   No current facility-administered medications for this visit.     No Known Allergies  Social History   Socioeconomic History  . Marital status: Married    Spouse name: Not on file  . Number of children: 2  . Years of education: Not on file  . Highest education level: Not on file  Occupational History  . 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 Financial resource strain: Not on file  . Food insecurity:    Worry: Not on file    Inability: Not on file  . Transportation needs:    Medical: Not on file    Non-medical: Not on file  Tobacco Use  . Smoking status: Never Smoker  . Smokeless tobacco: Never Used  Substance and Sexual Activity  . Alcohol use: Yes    Alcohol/week: 0.0 standard drinks  Comment: occas  . Drug use: No  . Sexual activity: Not on file  Lifestyle  . Physical activity:    Days per week: Not on file    Minutes per session: Not on file  . Stress: Not on file  Relationships  . Social connections:    Talks on phone: Not on file    Gets together: Not on file    Attends religious service: Not on file    Active member of club or organization: Not on file    Attends meetings of clubs or organizations: Not on file    Relationship status: Not on file  . Intimate partner violence:    Fear of current or ex partner: Not on file    Emotionally abused: Not on file    Physically abused: Not on file    Forced sexual activity: Not on  file  Other Topics Concern  . Not on file  Social History Narrative  . Not on file    Family History  Problem Relation Age of Onset  . Heart failure Father   . Colon cancer Neg Hx     Review of Systems:  As stated in the HPI and otherwise negative.   BP 132/78   Pulse 72   Ht 6' 1.25" (1.861 m)   Wt 216 lb 3.2 oz (98.1 kg)   SpO2 96%   BMI 28.33 kg/m   Physical Examination: General: Well developed, well nourished, NAD  HEENT: OP clear, mucus membranes moist  SKIN: warm, dry. No rashes. Neuro: No focal deficits  Musculoskeletal: Muscle strength 5/5 all ext  Psychiatric: Mood and affect normal  Neck: No JVD, no carotid bruits, no thyromegaly, no lymphadenopathy.  Lungs:Clear bilaterally, no wheezes, rhonci, crackles Cardiovascular: Regular rate and rhythm. No murmurs, gallops or rubs. Abdomen:Soft. Bowel sounds present. Non-tender.  Extremities: No lower extremity edema. Pulses are 2 + in the bilateral DP/PT.  EKG:  EKG is ordered today. The ekg ordered today demonstrates NSR, rate 72 bpm. Incomplete RBBB  Recent Labs: 08/26/2018: BUN 21; Creatinine, Ser 1.20; Hemoglobin 15.8; Platelets 210; Potassium 4.1; Sodium 137   Lipid Panel No results found for: CHOL, TRIG, HDL, CHOLHDL, VLDL, LDLCALC, LDLDIRECT   Wt Readings from Last 3 Encounters:  10/23/18 216 lb 3.2 oz (98.1 kg)  09/24/18 209 lb 12.8 oz (95.2 kg)  08/22/18 215 lb 9.6 oz (97.8 kg)     Other studies Reviewed: Additional studies/ records that were reviewed today include: . Review of the above records demonstrates:    Assessment and Plan:   1. PVCs/PACs: No palpitations on Cardizem. Will continue Cardizem. He uses Xanax prn for anxiety.   2. Anxiety: Xanax prn. I will give him 30 pills. We will not refill. He will call primary care to discuss  Current medicines are reviewed at length with the patient today.  The patient does not have concerns regarding medicines.  The following changes have been  made:  no change  Labs/ tests ordered today include:   Orders Placed This Encounter  Procedures  . EKG 12-Lead    Disposition:   FU with me in 12  months  Signed, Lauree Chandler, MD 10/23/2018 10:13 AM    [address], [place], [place]  [address] Phone: [phone]; Fax: [phone]

## 2018-10-23 ENCOUNTER — Ambulatory Visit: Payer: BC Managed Care – PPO | Admitting: Cardiovascular Disease

## 2018-10-23 ENCOUNTER — Encounter: Payer: Self-pay | Admitting: Cardiovascular Disease

## 2018-10-23 VITALS — BP 132/78 | HR 72 | Ht 73.25 in | Wt 216.2 lb

## 2018-10-23 DIAGNOSIS — F419 Anxiety disorder, unspecified: Secondary | ICD-10-CM | POA: Diagnosis not present

## 2018-10-23 DIAGNOSIS — I493 Ventricular premature depolarization: Secondary | ICD-10-CM

## 2018-10-23 DIAGNOSIS — R002 Palpitations: Secondary | ICD-10-CM

## 2018-10-23 MED ORDER — DILTIAZEM HCL ER COATED BEADS 120 MG PO CP24: 120.0000 mg | ORAL_CAPSULE | Freq: Every day | ORAL | 3 refills | Status: DC

## 2018-10-23 MED ORDER — ALPRAZOLAM 0.25 MG PO TABS: 0.2500 mg | ORAL_TABLET | Freq: Three times a day (TID) | ORAL | 0 refills | Status: DC | PRN

## 2018-10-23 NOTE — Patient Instructions (Signed)

## 2018-11-05 ENCOUNTER — Ambulatory Visit: Payer: BC Managed Care – PPO | Admitting: Internal Medicine

## 2018-11-05 ENCOUNTER — Encounter: Payer: Self-pay | Admitting: Internal Medicine

## 2018-11-05 VITALS — BP 144/86 | HR 77 | Temp 97.0°F | Ht 73.0 in | Wt 218.0 lb

## 2018-11-05 DIAGNOSIS — R1319 Other dysphagia: Secondary | ICD-10-CM | POA: Diagnosis not present

## 2018-11-05 DIAGNOSIS — M542 Cervicalgia: Secondary | ICD-10-CM | POA: Diagnosis not present

## 2018-11-05 NOTE — Patient Instructions (Addendum)
Continue Nexium 40 mg daily  Repeat colonoscopy in 5 years  We will schedule a consult with Dr. Wilburn [facility]) to evaluate left mandibular/ maxillary pain / fullness  Further recommendations to follow       Thank you for entrusting me with your care. I appreciate the opportunity to create valuable relationships with patients and family members. You may receive a questionnaire in the mail regarding your visit here today.  It would be appreciated if you would take the time to return it.  If you were not completely satisfied with your experience, I would love to discuss any concerns with you.   Bridgette Habermann, M.D. [facility]

## 2018-11-05 NOTE — Progress Notes (Signed)
Primary Care Physician:  Tomasa Hose, NP Primary Gastroenterologist:  Dr. Gala Romney  Pre-Procedure History & Physical: HPI:  Warren Barr is a 52 y.o. male here for follow-up of the following issues.  He previously described esophageal dysphagia.  EGD was pretty much unremarkable.  He was dilated with large bore [facility].  He says his esophageal dysphagia symptoms have improved significantly.  He complains prominently of left anterior cervical/ mandibular and possibly some posterior maxillary discomfort all on the left side.  Complains of a fullness from time to time.  States he has difficulty chewing his food because of this.  Denies out and out ear pain.  He tells me he does have a history of teeth grinding.  Dentist gave him a mouthguard previously.  He woke up in the middle the night and threw it against the wall and never wore it again. Denies any typical reflux symptoms at this point -  being maintained on Nexium 40 mg daily.  Has not had any difficulty with hearing.  Has not noticed any new lumps, no fever, chills.  Denies any thyroid issues previously. Last colonoscopy - multiple adenomas removed; he is slated to have a surveillance examination in 5 years. He reports getting regular exercise.  He does describe chronic free-floating stress and anxiety.   Past Medical History:  Diagnosis Date  . Anxiety states   . Dizziness   . PVC (premature ventricular contraction)     Past Surgical History:  Procedure Laterality Date  . COLONOSCOPY N/A 12/13/2015   Procedure: COLONOSCOPY;  Surgeon: Daneil Dolin, MD;  [facility];  Service: Endoscopy;  Laterality: N/A;  245 - moved to 1:45 - office to notify  . COLONOSCOPY WITH PROPOFOL N/A 08/29/2018   Procedure: COLONOSCOPY WITH PROPOFOL;  Surgeon: Daneil Dolin, MD;  [facility];  Service: Endoscopy;  Laterality: N/A;  9:15am  . ESOPHAGOGASTRODUODENOSCOPY (EGD) WITH PROPOFOL N/A 08/29/2018   Procedure:  ESOPHAGOGASTRODUODENOSCOPY (EGD) WITH PROPOFOL;  Surgeon: Daneil Dolin, MD;  [facility];  Service: Endoscopy;  Laterality: N/A;  . Venia Minks DILATION N/A 08/29/2018   Procedure: Venia Minks DILATION;  Surgeon: Daneil Dolin, MD;  [facility];  Service: Endoscopy;  Laterality: N/A;  . Plantar fasciitis     Bilateral  . POLYPECTOMY  08/29/2018   Procedure: POLYPECTOMY;  Surgeon: Daneil Dolin, MD;  [facility];  Service: Endoscopy;;  cecal,descending  . VASECTOMY      Prior to Admission medications   Medication Sig Start Date End Date Taking? Authorizing Provider  ALPRAZolam (XANAX) 0.25 MG tablet Take 1 tablet (0.25 mg total) by mouth 3 (three) times daily as needed for anxiety. 10/23/18  Yes Burnell Blanks, MD  diltiazem (CARDIZEM CD) 120 MG 24 hr capsule Take 1 capsule (120 mg total) by mouth daily. 10/23/18  Yes Burnell Blanks, MD  esomeprazole (NEXIUM) 40 MG capsule Take 40 mg by mouth daily at 12 noon.   Yes [provider]    Allergies as of 11/05/2018  . (No Known Allergies)    Family History  Problem Relation Age of Onset  . Heart failure Father   . Colon cancer Neg Hx     Social History   Socioeconomic History  . Marital status: Married    Spouse name: Not on file  . Number of children: 2  . Years of education: Not on file  . Highest education level: Not on file  Occupational History  . 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 Financial resource strain: Not on file  . Food insecurity:    Worry: Not on file    Inability: Not on file  . Transportation needs:    Medical: Not on file    Non-medical: Not on file  Tobacco Use  . Smoking status: Never Smoker  . Smokeless tobacco: Never Used  Substance and Sexual Activity  . Alcohol use: Yes    Alcohol/week: 0.0 standard drinks    Comment: occas  . Drug use: No  . Sexual activity: Not on  file  Lifestyle  . Physical activity:    Days per week: Not on file    Minutes per session: Not on file  . Stress: Not on file  Relationships  . Social connections:    Talks on phone: Not on file    Gets together: Not on file    Attends religious service: Not on file    Active member of club or organization: Not on file    Attends meetings of clubs or organizations: Not on file    Relationship status: Not on file  . Intimate partner violence:    Fear of current or ex partner: Not on file    Emotionally abused: Not on file    Physically abused: Not on file    Forced sexual activity: Not on file  Other Topics Concern  . Not on file  Social History Narrative  . Not on file    Review of Systems: See HPI, otherwise negative ROS  Physical Exam: BP (!) 144/86   Pulse 77   Temp (!) 97 F (36.1 C) (Oral)   Ht 6\' 1"  (1.854 m)   Wt 218 lb (98.9 kg)   BMI 28.76 kg/m  General:   Alert,  Well-developed, well-nourished, pleasant and cooperative in NAD Neck:  Supple; no masses or thyromegaly. No significant cervical adenopathy.  He does perceive some left sub-mandibular tenderness to palpation.  I do not feel anything abnormal. Lungs:  Clear throughout to auscultation.   No wheezes, crackles, or rhonchi. No acute distress. Heart:  Regular rate and rhythm; no murmurs, clicks, rubs,  or gallops. Abdomen: Non-distended, normal bowel sounds.  Soft and nontender without appreciable mass or hepatosplenomegaly.  Pulses:  Normal pulses noted. Extremities:  Without clubbing or edema.  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  He now has persisting localized left neck and jaw pain.  History of teeth grinding. There is an element "fullness".  I would entertain the diagnosis of globus hystericus but am hesitant in   Going in that direction with the component of pain and unilaterality. He certainly may have underlying ENT etiology for his symptoms.  Before ordering a neck CT, I feel it would be most appropriate for him to have an ENT evaluation. GERD seems to be well controlled on Nexium.    Recommendations:  Continue Nexium 40 mg daily  Repeat colonoscopy in 5 years  We will schedule a consult with Dr. Wilburn [facility]NT) to evaluate left mandibular/ maxillary pain / fullness  Further recommendations to follow     Notice: This dictation was prepared with Dragon dictation along with smaller phrase technology. Any transcriptional errors that result from this process  are unintentional and may not be corrected upon review.

## 2018-11-06 ENCOUNTER — Other Ambulatory Visit: Payer: Self-pay

## 2018-11-06 DIAGNOSIS — R6884 Jaw pain: Secondary | ICD-10-CM

## 2018-11-29 ENCOUNTER — Other Ambulatory Visit: Payer: Self-pay | Admitting: Otolaryngology

## 2018-11-29 DIAGNOSIS — M542 Cervicalgia: Secondary | ICD-10-CM

## 2018-12-02 ENCOUNTER — Ambulatory Visit: Payer: BC Managed Care – PPO | Admitting: Nurse Practitioner

## 2018-12-16 ENCOUNTER — Ambulatory Visit (HOSPITAL_COMMUNITY)
Admission: RE | Admit: 2018-12-16 | Discharge: 2018-12-16 | Disposition: A | Discharge status: Home / Self Care | Payer: BC Managed Care – PPO | Source: Ambulatory Visit | Attending: Otolaryngology | Admitting: Otolaryngology

## 2018-12-16 DIAGNOSIS — M542 Cervicalgia: Secondary | ICD-10-CM | POA: Diagnosis present

## 2018-12-16 MED ORDER — IOHEXOL 300 MG/ML  SOLN
75.0000 mL | Freq: Once | INTRAMUSCULAR | Status: AC | PRN
  Administered 2018-12-16: 75 mL via INTRAVENOUS

## 2019-01-13 ENCOUNTER — Telehealth: Payer: Self-pay | Admitting: Internal Medicine

## 2019-01-13 NOTE — Telephone Encounter (Signed)
Pt was seen by RMR back in December and is starting to have problems again with reflux, dysphagia. He has been taking Nexium, but doesn't think it's helping. Please advise. [phone]

## 2019-01-14 NOTE — Telephone Encounter (Signed)
Spoke with pt. He is taking Nexium at noon daily. Pt was asked if noon was his first meal of the day. Pt said no and he would try taking Nexium prior to his first meal of the day. Pt isn't sure if his mediation needs to change since he takes it daily and isn't getting relief from his Gerd symptoms.

## 2019-01-14 NOTE — Telephone Encounter (Signed)
Lmom, waiting on a return call.  

## 2019-01-14 NOTE — Telephone Encounter (Signed)
Pt was returning a call to AM. I told him that AM was at lunch and would have to call him back.

## 2019-01-17 NOTE — Telephone Encounter (Signed)
Have him call back when taking it in the morning and let us know if it is any better. If not we may need to go to bid dosing. If he continues with dysphagia, may need OV for further evaluation.

## 2019-01-20 NOTE — Telephone Encounter (Signed)
Spoke with pt. He feels that taking the Nexium in the mornings has improved a lot. The problem that he continues to have is eating his dinner at night. He says it takes him 45 mins to eat dinner due to feeling like he is going to choke if he doesn't chew is food slowly and for a long time.

## 2019-01-21 ENCOUNTER — Encounter: Payer: Self-pay | Admitting: Internal Medicine

## 2019-01-21 NOTE — Telephone Encounter (Signed)
SCHEDULED AND LETTER SENT  °

## 2019-01-21 NOTE — Telephone Encounter (Signed)
Noted. Given persistent dysphagia let's have him come in for an OV. Glad his GERD is improved.

## 2019-01-21 NOTE — Telephone Encounter (Signed)
Pt notified. Please schedule apt with EG for Dysphagia.

## 2019-03-21 ENCOUNTER — Other Ambulatory Visit: Payer: Self-pay | Admitting: Cardiology

## 2019-03-21 ENCOUNTER — Other Ambulatory Visit: Payer: Self-pay | Admitting: Internal Medicine

## 2019-03-27 ENCOUNTER — Telehealth: Payer: Self-pay | Admitting: Nurse Practitioner

## 2019-03-27 ENCOUNTER — Other Ambulatory Visit: Payer: Self-pay

## 2019-03-27 ENCOUNTER — Encounter: Payer: Self-pay | Admitting: Nurse Practitioner

## 2019-03-27 ENCOUNTER — Ambulatory Visit: Payer: BC Managed Care – PPO | Admitting: Allergy & Immunology

## 2019-03-27 ENCOUNTER — Ambulatory Visit (INDEPENDENT_AMBULATORY_CARE_PROVIDER_SITE_OTHER): Payer: BC Managed Care – PPO | Admitting: Nurse Practitioner

## 2019-03-27 DIAGNOSIS — R1310 Dysphagia, unspecified: Secondary | ICD-10-CM | POA: Diagnosis not present

## 2019-03-27 DIAGNOSIS — F411 Generalized anxiety disorder: Secondary | ICD-10-CM

## 2019-03-27 DIAGNOSIS — K219 Gastro-esophageal reflux disease without esophagitis: Secondary | ICD-10-CM | POA: Diagnosis not present

## 2019-03-27 DIAGNOSIS — R1319 Other dysphagia: Secondary | ICD-10-CM

## 2019-03-27 NOTE — Telephone Encounter (Signed)
Spoke with patient about results of discussion with Dr. Gala Romney. Agreement to follow-up with PCP for anxiety (and depression?) workup with possible SSRI or other regular medication for better control. Keep follow-up in 3 months.

## 2019-03-27 NOTE — Assessment & Plan Note (Signed)
Overall GERD doing well on Nexium which he takes in the mornings.  He was initially having some worsening reflux but he was taking his Nexium at noon time.  When he switched to morning times his symptoms significantly improved.  Recommend he continue his PPI for now and avoid triggers.  Follow-up in 3 months.

## 2019-03-27 NOTE — Patient Instructions (Addendum)
Your health issues we discussed today were:   GERD (heartburn/reflux): 1. Continue taking your Nexium in the mornings you have been 2. Avoid triggers that tend to cause flareups of your GERD symptoms 3. 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 Globus hystericus goes by several names including globus pharyngeus and simply globus sensation 2. There are multiple possible causes, most of which we have evaluated.  I am providing further education about this below 3. I feel that we have your heartburn under pretty good control, which can cause issues.  We have done an upper endoscopy with dilation and a barium swallow to look for any esophageal abnormalities, which can also cause globus sensation.  Another major cause is depression and/or anxiety.  This is why I recommended speaking with primary care about better management of anxiety symptoms to see if it makes an improvement 4. I will discuss with Dr. Gala Romney to see if he has any other recommendations 5. We will follow-up with you early next week  Anxiety: 1. As we discussed, I feel trying to get better treatment of your anxiety with possible chronic/daily medications rather than just Xanax "as needed" will likely result in some improvement in your symptoms 2. We will forward our office note onto your primary care provider.  You can also contact them to discuss  Overall I recommend:  1. Continue your current medications 2. Call us if you have any questions or concerns 3. Follow-up in 3 months   Because of recent events of COVID-19 ("Coronavirus"), follow CDC recommendations:  Wash your hand frequently Avoid touching your face Stay away from people who are sick If you have symptoms such as fever, cough, shortness of breath then call your healthcare provider for further guidance If you are sick, STAY AT HOME unless otherwise directed by your healthcare provider. Follow directions from state and  national officials regarding staying safe   At [facility] we value your feedback. You may receive a survey about your visit today. Please share your experience as we strive to create trusting relationships with our patients to provide genuine, compassionate, quality care.  We appreciate your understanding and patience as we review any laboratory studies, imaging, and other diagnostic tests that are ordered as we care for you. Our office policy is 5 business days for review of these results, and any emergent or urgent results are addressed in a timely manner for your best interest. If you do not hear from our office in 1 week, please contact us.   We also encourage the use of MyChart, which contains your medical information for your review as well. If you are not enrolled in this feature, an access code is on this after visit summary for your convenience. Thank you for allowing [place] to be involved in your care.  It was great to see you today!  I hope you have a great day!!       Globus Pharyngeus Globus pharyngeus is a condition that makes it feel like you have a lump in your throat. It may also feel like you have something stuck in the front of your throat. This feeling may come and go. It is not painful, and it does not make it harder to swallow food or liquid. Globus pharyngeus does not cause changes that a health care provider can see during a physical exam. This condition usually goes away without treatment. What are the causes? Often, no cause can be found. The most common  cause of globus pharyngeus is a condition that causes stomach juices to flow back up into the throat (gastroesophageal reflux). Other possible causes include:  Overstimulation of nerves that control swallowing.  Irritation of nerves that control swallowing (neuralgia).  An enlarged gland in the lower neck (thyroid gland).  Growth of tonsil tissue at the base of the tongue (lingual  tonsil).  Anxiety.  Depression. What are the signs or symptoms? The main symptom of this condition is a feeling of a lump in your throat. This feeling usually comes and goes. How is this diagnosed? This condition may be diagnosed after other conditions have been ruled out. You may have tests, such as:  A swallow study.  Ear, nose, and throat evaluation.  An exam of your throat using a thin, flexible tube with a light and camera on the end (endoscopy). How is this treated? This condition may go away on its own, without treatment. In some cases, antidepressant medicines may be helpful. Follow these instructions at home:   Follow instructions from your health care provider about eating or drinking restrictions.  Take over-the-counter and prescription medicines only as told by your health care provider.  Keep all follow-up visits as told by your health care provider. This is important.  Follow instructions from your health care provider about home care for gastroesophageal reflux. Your health care provider may recommend that you: ? Do not eat or drink anything that causes heartburn. ? Do not eat heavy meals close to bedtime. ? Do not drink caffeine. ? Do not drink alcohol. ? Raise the head of your bed. ? Sleep on your left side. Contact a health care provider if:  Your symptoms get worse.  You have throat pain.  You have trouble swallowing.  Food or liquid comes back up into your mouth.  You lose weight without trying. Get help right away if:  You develop swelling in your throat. Summary  Globus pharyngeus is a condition that makes it feel like you have a lump in your throat.  This condition usually goes away without treatment. This information is not intended to replace advice given to you by your health care provider. Make sure you discuss any questions you have with your health care provider. Document Released: 07/19/2016 Document Revised: 07/19/2016 Document  Reviewed: 07/19/2016 Elsevier Interactive Patient Education  2019 [employer].

## 2019-03-27 NOTE — Progress Notes (Signed)
Referring Provider: Tomasa Hose, NP Primary Care Physician:  Tomasa Hose, NP Primary GI:  Dr. Gala Romney  NOTE: Service was provided via telemedicine and was requested by the patient due to COVID-19 pandemic.  Method of visit: Doxy.Me  Patient Location: Home  Provider Location: Office  Reason for Phone Visit: Follow-up  The patient was consented to phone follow-up via telephone encounter including billing of the encounter (yes/no): Yes  Persons present on the phone encounter, with roles: Wife  Total time (minutes) spent on medical discussion: 22 minutes  Chief Complaint  Patient presents with  . Dysphagia    Trouble swallowing at supper    HPI:   Warren Barr is a 53 y.o. male who presents for virtual visit regarding: For follow-up on dysphasia.  Patient was last seen in our office 11/05/2018 for the same.  At his visit he noted previous dysphasia status post [facility] dilation with significant improvement.  Also noted cervical neck and axillary pain in the setting of known teeth grinding which makes it intermittently difficult to chew his food.  Reflux well controlled on Nexium.  Recommended ENT evaluation for neck and jaw pain.  Continue Nexium.  His most recent EGD was 08/29/2018 which is essentially normal.  Status post empiric dilation.  He called our office 01/13/2019 noting recurrent symptoms of dysphagia on Nexium.  He noted Nexium was not helping but he was only taking it at noon.  Recommend he take it first on the day as previously recommended.  Persistent dysphasia with solid foods in the evenings.  He was subsequently scheduled for follow-up visit.  Today he states he's doing ok overall. He initially stated he began having more dysphagia only in the evenings. When chewing his jaw (just under the chin) gets tired and it takes a long time to eat. He then clarified that ood doesn't get stuck on the way down. No pill dysphagia. GERD is doing pretty good on  Nexium, is taking it in the mornings. He isn't currently taking Nexium every day. Has identified evening triggers (ie- red meat). Has a history of anxiety for the past 4-5 years. He does have episodic issues with high anxiety. Denies abdominal pain, N/V, hematochezia, melena, fever, chills, unintentional weight loss. Denies URI and flu-like symptoms. Denies chest pain, dyspnea, dizziness, lightheadedness, syncope, near syncope. Denies any other upper or lower GI symptoms.  Past Medical History:  Diagnosis Date  . Anxiety states   . Dizziness   . PVC (premature ventricular contraction)     Past Surgical History:  Procedure Laterality Date  . COLONOSCOPY N/A 12/13/2015   Procedure: COLONOSCOPY;  Surgeon: Daneil Dolin, MD;  [facility];  Service: Endoscopy;  Laterality: N/A;  245 - moved to 1:45 - office to notify  . COLONOSCOPY WITH PROPOFOL N/A 08/29/2018   Procedure: COLONOSCOPY WITH PROPOFOL;  Surgeon: Daneil Dolin, MD;  Location: [facility];  Service: Endoscopy;  Laterality: N/A;  9:15am  . ESOPHAGOGASTRODUODENOSCOPY (EGD) WITH PROPOFOL N/A 08/29/2018   Procedure: ESOPHAGOGASTRODUODENOSCOPY (EGD) WITH PROPOFOL;  Surgeon: Daneil Dolin, MD;  [facility];  Service: Endoscopy;  Laterality: N/A;  . Venia Minks DILATION N/A 08/29/2018   Procedure: Venia Minks DILATION;  Surgeon: Daneil Dolin, MD;  [facility];  Service: Endoscopy;  Laterality: N/A;  . Plantar fasciitis     Bilateral  . POLYPECTOMY  08/29/2018   Procedure: POLYPECTOMY;  Surgeon: Daneil Dolin, MD;  Location: [facility];  Service: Endoscopy;;  cecal,descending  . VASECTOMY      Current Outpatient Medications  Medication Sig Dispense Refill  . ALPRAZolam (XANAX) 0.25 MG tablet Take 1 tablet (0.25 mg total) by mouth 3 (three) times daily as needed for anxiety. (Patient taking differently: Take 0.25 mg by mouth as needed for anxiety. ) 30 tablet 0  . diltiazem (CARDIZEM CD) 120 MG 24 hr  capsule Take 1 capsule (120 mg total) by mouth daily. (Patient taking differently: Take 120 mg by mouth as needed. ) 90 capsule 3  . esomeprazole (NEXIUM) 40 MG capsule TAKE ONE CAPSULE BY MOUTH EVERY DAY (Patient taking differently: as needed. ) 90 capsule 3   No current facility-administered medications for this visit.     Allergies as of 03/27/2019  . (No Known Allergies)    Family History  Problem Relation Age of Onset  . Heart failure Father   . Colon cancer Neg Hx     Social History   Socioeconomic History  . Marital status: Married    Spouse name: Not on file  . Number of children: 2  . Years of education: Not on file  . Highest education level: Not on file  Occupational History  . 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 Financial resource strain: Not on file  . Food insecurity:    Worry: Not on file    Inability: Not on file  . Transportation needs:    Medical: Not on file    Non-medical: Not on file  Tobacco Use  . Smoking status: Never Smoker  . Smokeless tobacco: Former Network engineer and Sexual Activity  . Alcohol use: Yes    Alcohol/week: 0.0 standard drinks    Comment: occas  . Drug use: No  . Sexual activity: Not on file  Lifestyle  . Physical activity:    Days per week: Not on file    Minutes per session: Not on file  . Stress: Not on file  Relationships  . Social connections:    Talks on phone: Not on file    Gets together: Not on file    Attends religious service: Not on file    Active member of club or organization: Not on file    Attends meetings of clubs or organizations: Not on file    Relationship status: Not on file  Other Topics Concern  . Not on file  Social History Narrative  . Not on file    Review of Systems: General: Negative for anorexia, weight loss, fever, chills, fatigue, weakness. Eyes: Negative for vision changes.  ENT: Negative for hoarseness. CV:  Negative for chest pain, angina, palpitations, peripheral edema.  Respiratory: Negative for dyspnea at rest, cough, sputum, wheezing.  GI: See history of present illness. Psych: Admits chronic anxiety.  Endo: Negative for unusual weight change.  Heme: Negative for bruising or bleeding. Allergy: Negative for rash or hives.  Physical Exam: Note: limited exam due to virtual visit General:   Alert and oriented. Pleasant and cooperative. Well-nourished and well-developed.  Head:  Normocephalic and atraumatic. Eyes:  Without icterus, sclera clear and conjunctiva pink.  Ears:  Normal auditory acuity. Skin:  Intact without facial significant lesions or rashes. Neurologic:  Alert and oriented x4;  grossly normal neurologically. Psych:  Alert and cooperative. Normal mood and affect. Heme/Lymph/Immune: No excessive bruising noted.

## 2019-03-27 NOTE — Assessment & Plan Note (Signed)
The patient initially stated he was having worsening dysphagia.  Upon further discussion and clarification it was revealed that he was not having necessarily difficulty swallowing with "food getting stuck" but rather having some form of jaw fatigue when chewing.  This only occurred in the evenings.  He has been evaluated by ENT at [facility] who felt the etiology of his neck pain was unclear but recommended CT of the neck with contrast.  CT of the soft tissues of the neck was completed in January 2020 which found normal study and no explanation of symptoms.  He has not had a follow-up.  As an aside, the patient was told about possibility of globus hystericus after his EGD.  He was curious more about this.  His wife notes that he has had significant anxiety for a number of years.  He does have as needed Xanax, but no long-term anxiolytic or antidepressant.  I will discuss further with Dr. Gala Romney about possible gross Starkes any recommended treatments.  I feel it is likely anxiety related given that he has had imaging, direct visualization under endoscopy, treatment of GERD to a satisfactory level.  I recommended trying to evaluate and treat his anxiety with primary care, as per below.  We will reach back out to him next week to tell him the results of our discussion.

## 2019-03-27 NOTE — Assessment & Plan Note (Addendum)
The patient notes significant anxiety.  He is on Xanax as needed.  Given his whole constellation of symptoms I feel anxiety is likely a significant contributor.  Recommended he follow-up with primary care to discuss anxiety treatment.  He asked if we managed anxiety.  I discussed defined arc of mental health, depression, anxiety treatment including multiple medication options that is best handled by primary care or a behavioral health specialist.  I think if we can get his anxiety better controlled he will likely have improvement in his symptoms, especially if there is possibility of globus hystericus given that we have evaluated and treated most other typical causes.  Recommend follow-up in 3 months.

## 2019-03-31 NOTE — Progress Notes (Signed)
CC'D TO PCP °

## 2019-05-15 ENCOUNTER — Other Ambulatory Visit: Payer: BC Managed Care – PPO

## 2019-05-15 ENCOUNTER — Telehealth: Payer: Self-pay

## 2019-05-15 DIAGNOSIS — Z20822 Contact with and (suspected) exposure to covid-19: Secondary | ICD-10-CM

## 2019-05-15 NOTE — Telephone Encounter (Signed)
rec'd request from [facility] Dept. To schedule pt. For COVID testing.  PC to pt's wife, Claiborne Billings.  Scheduled pt. For COVID testing at [facility]; [facility]. At 2:15 PM today.  Advised to wear mask, and remain in car for testing.  Verb. Understanding.

## 2019-05-18 LAB — NOVEL CORONAVIRUS, NAA: SARS-CoV-2, NAA: NOT DETECTED

## 2019-05-27 ENCOUNTER — Other Ambulatory Visit: Payer: Self-pay | Admitting: Physician Assistant

## 2019-07-16 IMAGING — RF DG UGI W/ HIGH DENSITY W/KUB
12 of 17 series · 14 of 24 positions shown · non-contrast
Comparison: None.

CLINICAL DATA: Dysphagia, GERD

EXAM:
UPPER GI SERIES WITH KUB
TECHNIQUE: After obtaining a scout radiograph a routine upper GI series was
performed using thin and high density barium as well as effervescent
granules. Patient swallowed a 12.5 mm diameter barium tablet
FLUOROSCOPY TIME:  Fluoroscopy Time:  2 minutes 48 seconds
Radiation Exposure Index (if provided by the fluoroscopic device):
69.0 mGy
Number of Acquired Spot Images: 3 plus spots

[Series 1: t abdomen supine · 0.15mm/px · 1 of 1 slices shown]
[im 1/1]
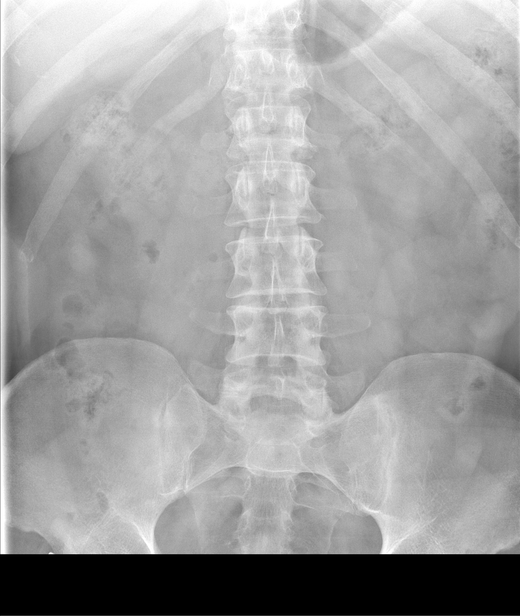

[Series 4: cp_standard · 0.18mm/px · 1 of 1 slices shown (1 of 11)]
[im 1/1]
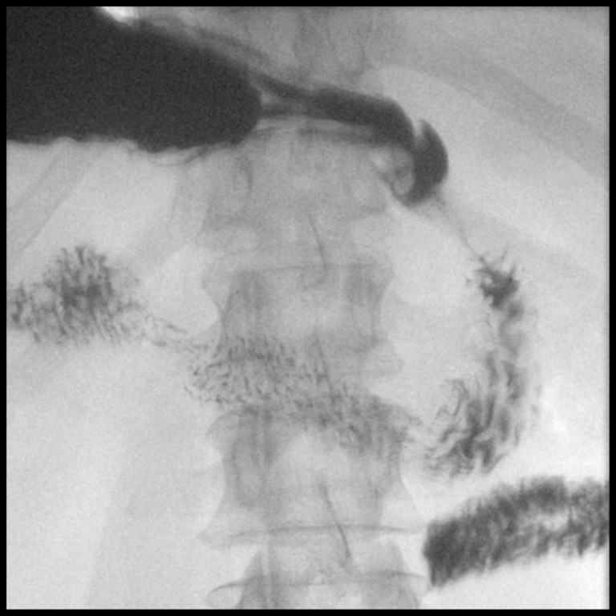

[Series 5: cp_standard · 0.18mm/px · 1 of 27 frames shown (2 of 11)]
[frame 23/27]
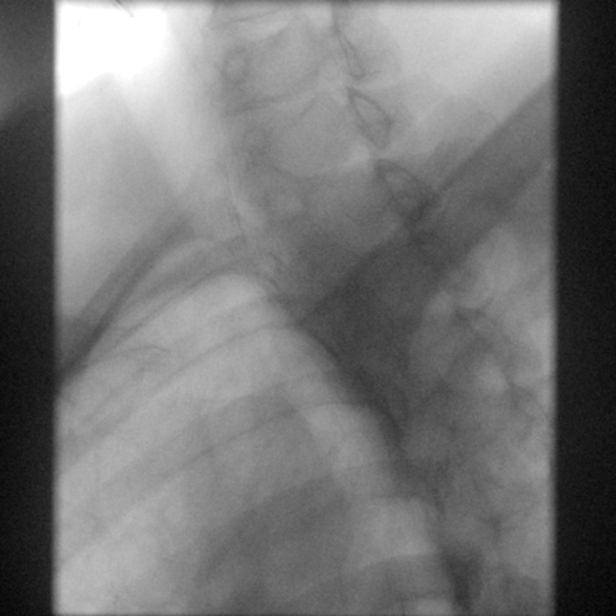

[Series 6: cp_standard · 0.18mm/px · 1 of 48 frames shown (3 of 11)]
[frame 25/48]
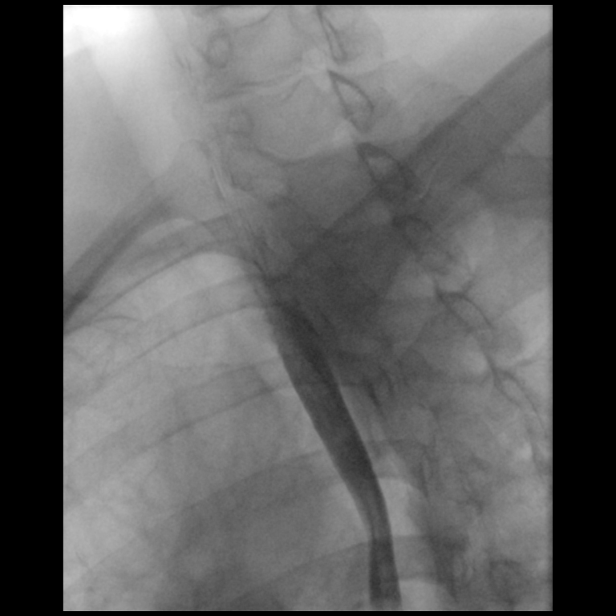

[Series 7: cp_standard · 0.18mm/px · 2 of 55 frames shown (4 of 11)]
[frame 9/55]
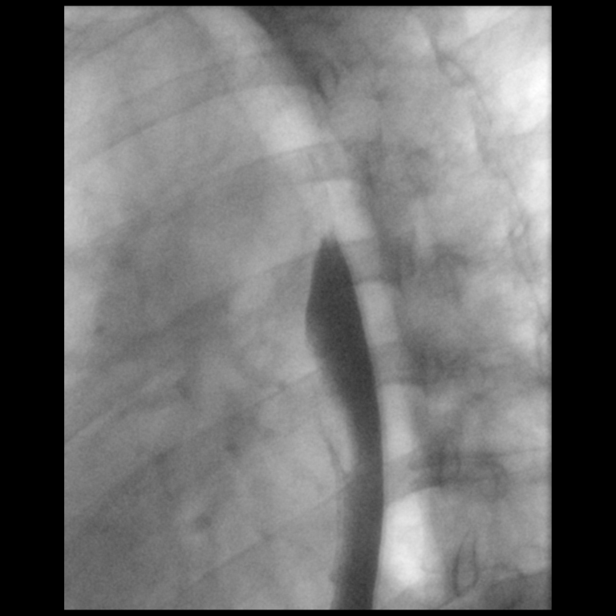
[frame 47/55]
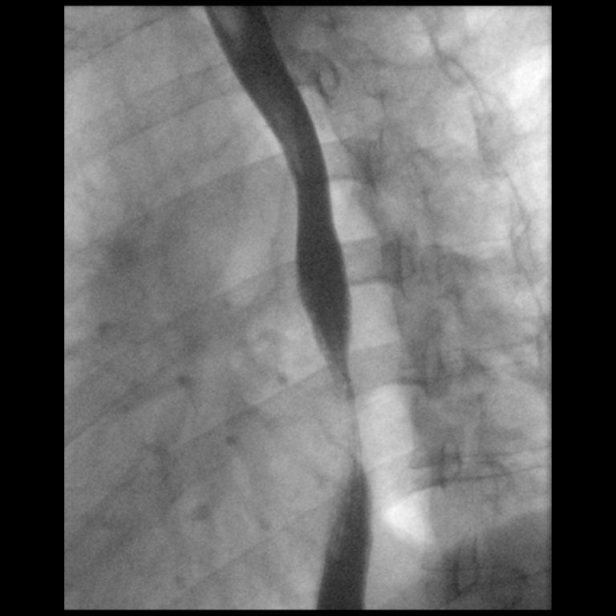

[Series 8: cp_standard · 0.18mm/px · 1 of 10 frames shown (5 of 11)]
[frame 6/10]
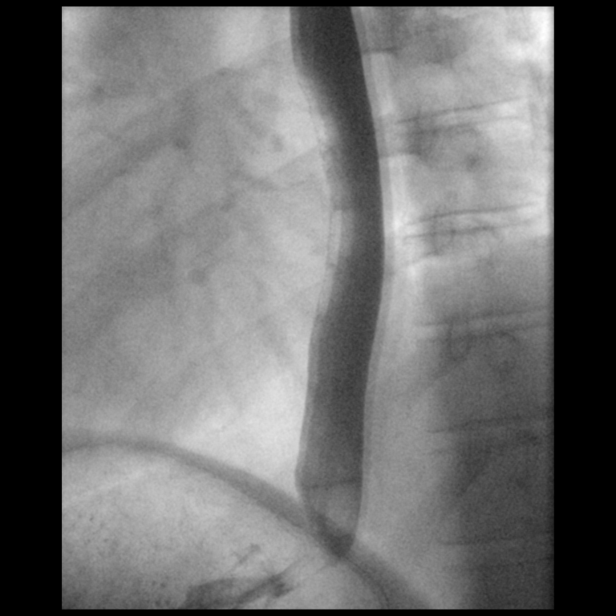

[Series 9: cp_standard · 0.18mm/px · 2 of 54 frames shown (6 of 11)]
[frame 9/54]
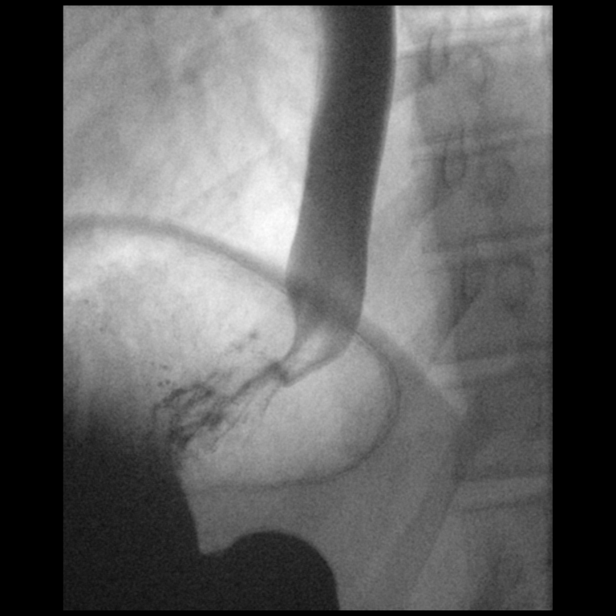
[frame 46/54]
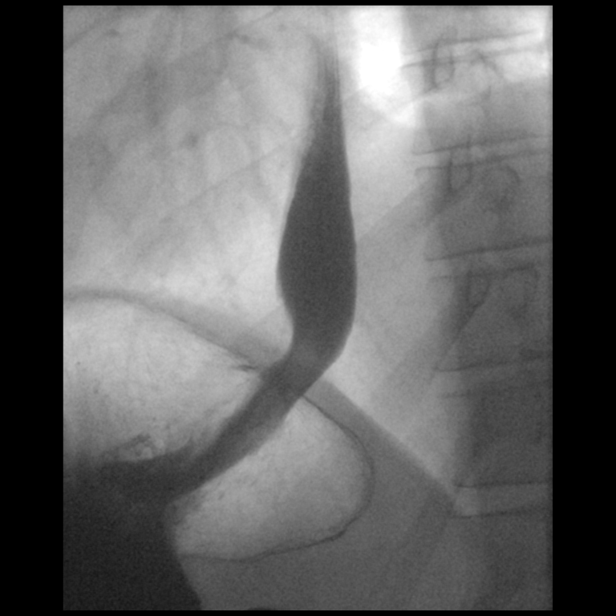

[Series 12: cp_standard · 0.18mm/px · 1 of 1 slices shown (7 of 11)]
[im 1/1]
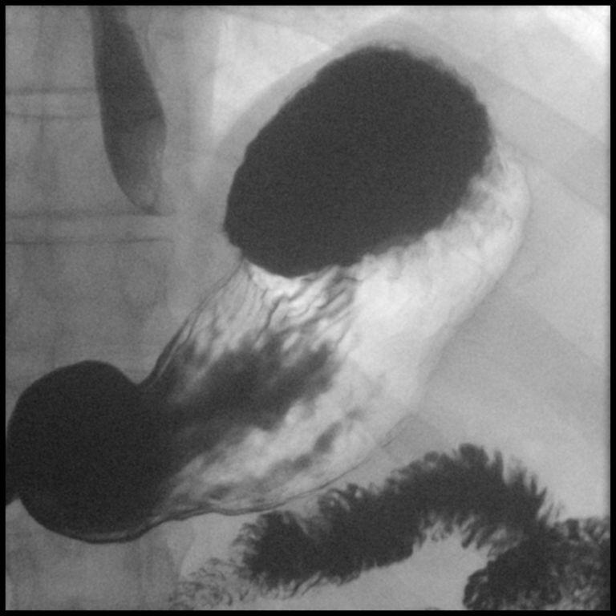

[Series 16: cp_standard · 0.19mm/px · 1 of 1 slices shown (8 of 11)]
[im 1/1]
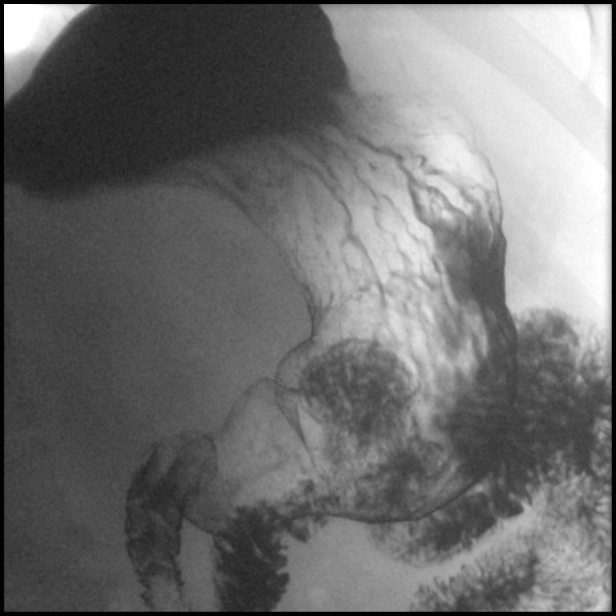

[Series 17: cp_standard · 0.20mm/px · 1 of 1 slices shown (9 of 11)]
[im 1/1]
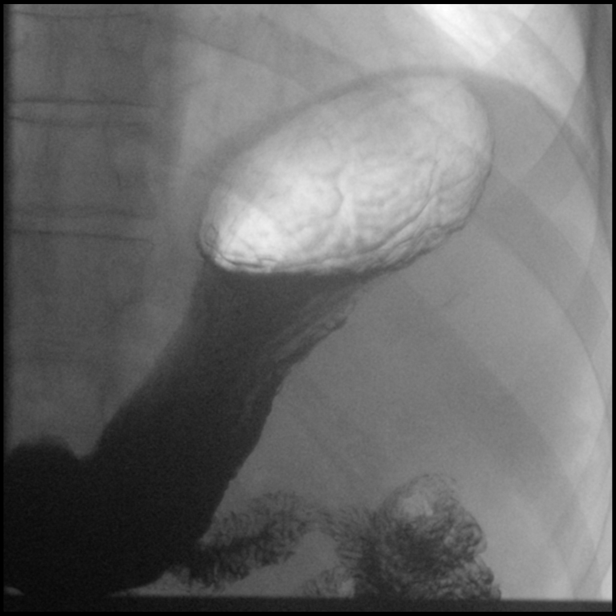

[Series 21: cp_standard · 0.19mm/px · 1 of 1 slices shown (10 of 11)]
[im 1/1]
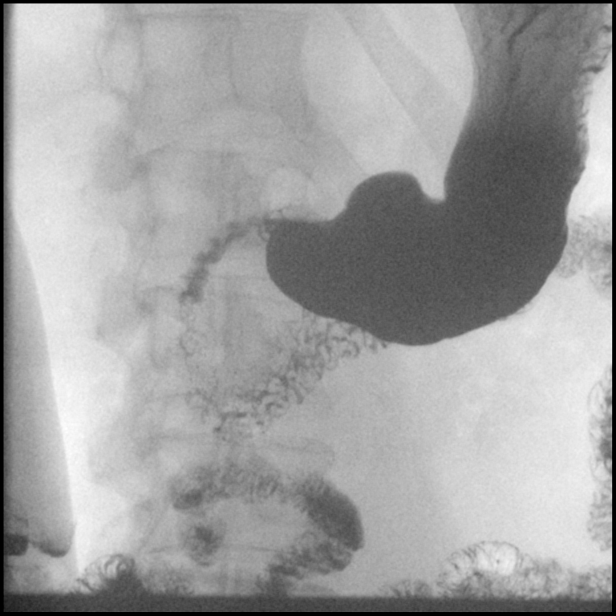

[Series 24: cp_standard · 0.20mm/px · 1 of 1 slices shown (11 of 11)]
[im 1/1]
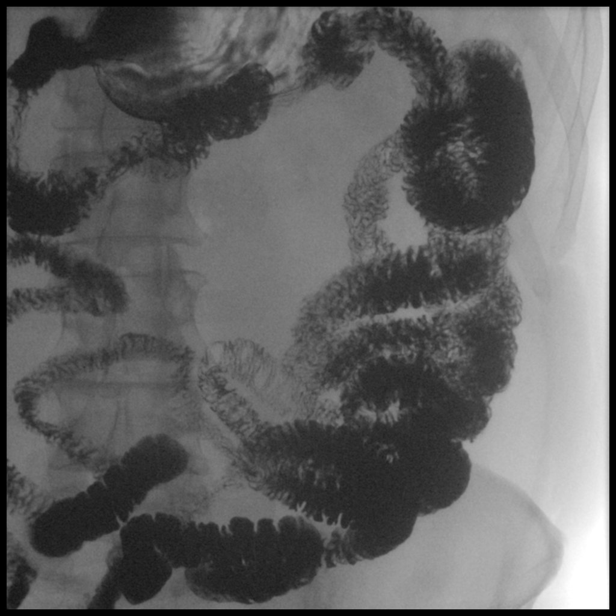

[14 of 24 positions shown; findings below may reference images not displayed]

FINDINGS: Normal esophageal distention.

No esophageal mass or stricture.

12.5 mm diameter barium tablet passed from oral cavity to stomach
without obstruction.

Targeted rapid sequence imaging of the cervical esophagus and
hypopharynx showed no laryngeal penetration or aspiration.

No hiatal hernia or reflux identified during exam.

Stomach distends normally with normal rugal fold pattern.

No gastric mass, ulceration or outlet obstruction.

Duodenal bulb and sweep normal appearance.

Small bowel loops normal appearance, normal in caliber with normal
mucosal folds.

No small bowel dilatation or stricture.

Contrast present to RIGHT colon by conclusion of exam
IMPRESSION: Normal upper GI exam.

## 2019-11-10 ENCOUNTER — Other Ambulatory Visit: Payer: Self-pay | Admitting: Dermatology

## 2020-01-21 ENCOUNTER — Encounter: Payer: Self-pay | Admitting: *Deleted

## 2020-02-01 ENCOUNTER — Ambulatory Visit: Payer: BC Managed Care – PPO | Attending: Internal Medicine

## 2020-02-01 DIAGNOSIS — Z23 Encounter for immunization: Secondary | ICD-10-CM | POA: Insufficient documentation

## 2020-02-01 NOTE — Progress Notes (Signed)
   Covid-19 Vaccination Clinic  Name:  LENIX SLEETER    MRN: [id number] DOB: Apr 05, 1966  02/01/2020  Mr. Mackin was observed post Covid-19 immunization for 15 minutes without incident. He was provided with Vaccine Information Sheet and instruction to access the V-Safe system.   Mr. Kosh was instructed to call 911 with any severe reactions post vaccine: Marland Kitchen Difficulty breathing  . Swelling of face and throat  . A fast heartbeat  . A bad rash all over body  . Dizziness and weakness   Immunizations Administered    Name Date Dose VIS Date Route   Pfizer COVID-19 Vaccine 02/01/2020  6:43 PM 0.3 mL 11/07/2019 Intramuscular   Manufacturer: [place]   Lot: [id number]   [place]: [id number]

## 2020-02-09 ENCOUNTER — Ambulatory Visit: Payer: BC Managed Care – PPO | Admitting: Dermatology

## 2020-02-09 NOTE — Progress Notes (Incomplete)
? ?  Follow-Up Visit ?  ?Subjective  ?Warren Barr is a 54 y.o. male who presents for the following: No chief complaint on file.. ? ?The following portions of the chart were reviewed this encounter and updated as appropriate:  ?   ?*** ?Location:  ?Duration:  ?Quality:  ?Associated Signs/Symptoms: ?Modifying Factors:  ?Severity:  ?Timing: ?Context:  ? ?{Review of Systems:34166::"No other skin or systemic complaints."} ? ?Objective  ?Well appearing patient in no apparent distress; mood and affect are within normal limits. ? ?{Exam:34163::"A full examination was performed including scalp, head, eyes, ears, nose, lips, neck, chest, axillae, abdomen, back, buttocks, bilateral upper extremities, bilateral lower extremities, hands, feet, fingers, toes, fingernails, and toenails. All findings within normal limits unless otherwise noted below."} ? ?No skin findings found. ? ?Assessment & Plan  ? ?

## 2020-02-22 ENCOUNTER — Ambulatory Visit: Payer: BC Managed Care – PPO | Attending: Internal Medicine

## 2020-02-22 DIAGNOSIS — Z23 Encounter for immunization: Secondary | ICD-10-CM

## 2020-02-22 NOTE — Progress Notes (Signed)
   Covid-19 Vaccination Clinic  Name:  Warren Barr    MRN: [id number] DOB: Jul 07, 1966  02/22/2020  [name] was observed post Covid-19 immunization for 15 minutes without incident. He was provided with Vaccine Information Sheet and instruction to access the V-Safe system.   [name] was instructed to call 911 with any severe reactions post vaccine: Marland Kitchen Difficulty breathing  . Swelling of face and throat  . A fast heartbeat  . A bad rash all over body  . Dizziness and weakness   Immunizations Administered    Name Date Dose VIS Date Route   Pfizer COVID-19 Vaccine 02/22/2020  8:12 AM 0.3 mL 11/07/2019 Intramuscular   Manufacturer: [place]   Lot: [id number]   [place]: [id number]

## 2020-04-27 ENCOUNTER — Other Ambulatory Visit: Payer: Self-pay

## 2020-04-27 ENCOUNTER — Encounter: Payer: Self-pay | Admitting: Dermatology

## 2020-04-27 ENCOUNTER — Ambulatory Visit: Payer: BC Managed Care – PPO | Admitting: Dermatology

## 2020-04-27 DIAGNOSIS — Z86007 Personal history of in-situ neoplasm of skin: Secondary | ICD-10-CM

## 2020-04-27 DIAGNOSIS — D044 Carcinoma in situ of skin of scalp and neck: Secondary | ICD-10-CM

## 2020-04-27 DIAGNOSIS — D099 Carcinoma in situ, unspecified: Secondary | ICD-10-CM

## 2020-04-27 DIAGNOSIS — D485 Neoplasm of uncertain behavior of skin: Secondary | ICD-10-CM | POA: Diagnosis not present

## 2020-04-27 MED ORDER — IMIQUIMOD 5 % EX CREA: TOPICAL_CREAM | Freq: Every evening | CUTANEOUS | 0 refills | Status: DC

## 2020-04-27 NOTE — Progress Notes (Signed)
° °  Follow-Up Visit   Subjective  KISHON GUANZON is a 54 y.o. male who presents for the following: Skin Problem (crown of scalp ( scc in past)  and left scalp weeks- tender to touch).  New growth Location: Left scalp Duration:  Quality:  Associated Signs/Symptoms: Modifying Factors:  Severity:  Timing: Context: Wife has seen and is concerned.  The following portions of the chart were reviewed this encounter and updated as appropriate:     Objective  Well appearing patient in no apparent distress; mood and affect are within normal limits.  A focused skin examination was performed including head and neck areas.   Assessment & Plan  Neoplasm of uncertain behavior of skin Left Parietal Scalp  Skin / nail biopsy Type of biopsy: tangential   Informed consent: discussed and consent obtained   Timeout: patient name, date of birth, surgical site, and procedure verified   Procedure prep:  Patient was prepped and draped in usual sterile fashion Prep type:  Chlorhexidine Anesthesia: the lesion was anesthetized in a standard fashion   Anesthetic:  1% lidocaine w/ epinephrine 1-100,000 local infiltration Instrument used: flexible razor blade   Hemostasis achieved with: ferric subsulfate   Outcome: patient tolerated procedure well   Post-procedure details: wound care instructions given   Additional details:  Base curetted and cauterized as for basal cell carcinoma.  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  If this fails, repeat biopsy will be obtained. Follow up visit for Damir Feezor, date of birth [birth year].  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  Mr.Siciliano did note that the crust very recently fell  off and today by examination there is a very subtle erythema over 1+ centimeter of the right crown without definite erosion or crusting.  I recommended against repeat biopsy because I would be guessing were to take a biopsy and I would prefer he try topical imiquimod to boost his local immunity.  This prescription will be applied 3 times weekly Monday Wednesday Friday ideally sometime after supper and will be continued either for 8 weeks or until Mr. Reedus sees fairly brisk poison ivy type inflammation on the area and then will be discontinued.  If it comes in small soft packages instead of ripping off the top he may puncture a pinhole in the package and squeeze out a tiny amount and use the same little package for 5 or 8 applications.  If this fails, I will obtain biopsies in the future.  Additionally there is a new pearly bubble on the left side of the scalp which could be an early basal cell cancer or slightly inflamed cyst.  This was shave biopsied and the base curetted and cauterized.  Mr. Palos can check the results of this microscopic either on my chart on Friday and/or by calling my office.  Routine follow-up will be scheduled 1 month after he finishes the imiquimod on the right scalp which will be sometime in the early fall.  He knows he can call me if there is any issue in the meantime.

## 2020-04-27 NOTE — Progress Notes (Signed)
Left scalp .7 cm treatment curet and cautery

## 2020-04-27 NOTE — Patient Instructions (Addendum)
sxs     Biopsy, Surgery (Curettage) & Surgery (Excision) Aftercare Instructions  1. Okay to remove bandage in 24 hours  2. Wash area with soap and water  3. Apply Vaseline to area twice daily until healed (Not Neosporin)  4. Okay to cover with a Band-Aid to decrease the chance of infection or prevent irritation from clothing; also it's okay to uncover lesion at home.  5. Suture instructions: return to our office in 7-10 or 10-14 days for a nurse visit for suture removal. Variable healing with sutures, if pain or itching occurs call our office. It's okay to shower or bathe 24 hours after sutures are given.  6. The following risks may occur after a biopsy, curettage or excision: bleeding, scarring, discoloration, recurrence, infection (redness, yellow drainage, pain or swelling).  7. For questions, concerns and results call our office at [place] before 4pm & Friday before 3pm. Biopsy results will be available in 1 week.

## 2020-04-28 DIAGNOSIS — D229 Melanocytic nevi, unspecified: Secondary | ICD-10-CM

## 2020-04-28 HISTORY — DX: Melanocytic nevi, unspecified: D22.9

## 2020-05-02 ENCOUNTER — Encounter: Payer: Self-pay | Admitting: Dermatology

## 2020-05-03 ENCOUNTER — Telehealth: Payer: Self-pay

## 2020-05-03 NOTE — Telephone Encounter (Signed)
-----   Message from Lavonna Monarch, MD sent at 04/29/2020 11:08 PM EDT ----- Although the microscopic is not being interpreted as skin cancer, Dr. Darene Lamer agrees with the recommendation to do a conservative simple excision with suture closure.  This can be scheduled with me or any doctor of his choice.

## 2020-05-03 NOTE — Telephone Encounter (Signed)
Phone call to patient with his pathology results. Patient aware of results.  

## 2020-05-28 NOTE — Addendum Note (Signed)
Addended by: Lavonna Monarch on: 05/28/2020 11:46 PM   Modules accepted: Orders

## 2020-06-10 ENCOUNTER — Encounter: Payer: Self-pay | Admitting: Dermatology

## 2020-06-10 ENCOUNTER — Other Ambulatory Visit: Payer: Self-pay

## 2020-06-10 ENCOUNTER — Ambulatory Visit (INDEPENDENT_AMBULATORY_CARE_PROVIDER_SITE_OTHER): Payer: BC Managed Care – PPO | Admitting: Dermatology

## 2020-06-10 DIAGNOSIS — D234 Other benign neoplasm of skin of scalp and neck: Secondary | ICD-10-CM

## 2020-06-10 DIAGNOSIS — D239 Other benign neoplasm of skin, unspecified: Secondary | ICD-10-CM

## 2020-06-10 MED ORDER — TERBINAFINE HCL 1 % EX CREA
1.0000 | TOPICAL_CREAM | Freq: Two times a day (BID) | CUTANEOUS | 0 refills | Status: DC
Start: 2020-06-10 — End: 2020-11-29

## 2020-06-10 NOTE — Patient Instructions (Signed)

## 2020-06-10 NOTE — Progress Notes (Signed)
EXC LEFT PARIETAL SCALP SITE SIZE 2.0 ANTERIOR MARGIN STAIN 3.0 PROLENE X1 MATTRESS AND 2 REGULAR SUTURE

## 2020-06-24 ENCOUNTER — Ambulatory Visit (INDEPENDENT_AMBULATORY_CARE_PROVIDER_SITE_OTHER): Payer: BC Managed Care – PPO

## 2020-06-24 ENCOUNTER — Other Ambulatory Visit: Payer: Self-pay

## 2020-06-24 DIAGNOSIS — Z4802 Encounter for removal of sutures: Secondary | ICD-10-CM

## 2020-06-29 NOTE — Progress Notes (Signed)
No sign or symptoms of infection  

## 2020-07-06 ENCOUNTER — Encounter: Payer: Self-pay | Admitting: Dermatology

## 2020-07-06 NOTE — Progress Notes (Signed)
   Follow-Up Visit   Subjective  Warren Barr is a 54 y.o. male who presents for the following: Procedure (Patient here today for treatment on left parietal scalp).  Atypical adnexal neoplasm Location: Scalp Duration:  Quality:  Associated Signs/Symptoms: Modifying Factors:  Severity:  Timing: Context: For excision  Objective  Well appearing patient in no apparent distress; mood and affect are within normal limits.  A focused examination was performed including Head, neck, regional lymph nodes.. Relevant physical exam findings are noted in the Assessment and Plan.   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  Skin excision - Left Parietal Scalp  Lesion length (cm):  0.6 Lesion width (cm):  0.6 Margin per side (cm):  0.2 Total excision diameter (cm):  1 Informed consent: discussed and consent obtained   Timeout: patient name, date of birth, surgical site, and procedure verified   Procedure prep:  Patient was prepped and draped in usual sterile fashion Prep type:  Chlorhexidine Anesthesia: the lesion was anesthetized in a standard fashion   Anesthetic:  1% lidocaine w/ epinephrine 1-100,000 local infiltration Instrument used: #15 blade   Hemostasis achieved with: suture   Outcome: patient tolerated procedure well with no complications   Post-procedure details: sterile dressing applied and wound care instructions given   Dressing type: petrolatum    Specimen 1 - Surgical pathology Differential Diagnosis: ATYPICAL  Check Margins: YES [id number]     I, Lavonna Monarch, MD, have reviewed all documentation for this visit.  The documentation on 07/06/20 for the exam, diagnosis, procedures, and orders are all accurate and complete.

## 2020-11-17 ENCOUNTER — Telehealth: Payer: Self-pay | Admitting: Cardiovascular Disease

## 2020-11-17 NOTE — Telephone Encounter (Signed)
Warren Barr is calling wanting to know if the appointment he is needing to schedule in order to receive further medications has to be in office or if it can be a virtual. Please advise.

## 2020-11-27 NOTE — Progress Notes (Signed)
Virtual Visit via Video Note   This visit type was conducted due to national recommendations for restrictions regarding the COVID-19 Pandemic (e.g. social distancing) in an effort to limit this patient's exposure and mitigate transmission in our community.  Due to his co-morbid illnesses, this patient is at least at moderate risk for complications without adequate follow up.  This format is felt to be most appropriate for this patient at this time.  All issues noted in this document were discussed and addressed.  A limited physical exam was performed with this format.  Please refer to the patient's chart for his consent to telehealth for [facility].       Date:  11/29/2020   ID:  Warren Barr, DOB 08-May-1966, MRN [id number] The patient was identified using 2 identifiers.  Patient Location: Other:  Work Provider Location: Office/Clinic  PCP:  Jettie Pagan, NP  Cardiologist:  Verne Carrow, MD   Electrophysiologist:  None   Evaluation Performed:  Follow-Up Visit  Chief Complaint:  Palpitations   Patient Profile: Warren Barr is a 55 y.o. male with:  Palpitations  PVCs/PACs  Holter 5/13: PVC burden 9.5%  Chest pain   Anxiety   Prior CV Studies: Echocardiogram 02/29/12 EF 60-65  Holter 03/2012 9.5% PVC burden  History of Present Illness:   Warren Barr was last seen by Dr. Clifton James in November 2019.  He is seen for follow-up.  He has been doing well.  He exercises daily.  He does not have chest pain, shortness of breath, syncope, leg edema.  He has palpitations once every few months.  He only takes Diltiazem as needed.  Overall, his palpitations are well controlled.     Past Medical History:  Diagnosis Date  . Anxiety states   . Atypical mole 07/07/2010   RIGHT THIGH WIDER SHAVE  . 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 Dizziness   . PVC (premature ventricular contraction)   . SCCA (squamous cell carcinoma) of skin  09/12/2018   RIGHT CROWN SCALP-CX3,5FU  . SCCA (squamous cell carcinoma) of skin 05/27/2019   RIGHT CROWN SCALP-TXpBX   Past Surgical History:  Procedure Laterality Date  . COLONOSCOPY N/A 12/13/2015   Procedure: COLONOSCOPY;  Surgeon: Corbin Ade, MD;  [facility];  Service: Endoscopy;  Laterality: N/A;  245 - moved to 1:45 - office to notify  . COLONOSCOPY WITH PROPOFOL N/A 08/29/2018   Procedure: COLONOSCOPY WITH PROPOFOL;  Surgeon: Corbin Ade, MD;  [facility];  Service: Endoscopy;  Laterality: N/A;  9:15am  . ESOPHAGOGASTRODUODENOSCOPY (EGD) WITH PROPOFOL N/A 08/29/2018   Procedure: ESOPHAGOGASTRODUODENOSCOPY (EGD) WITH PROPOFOL;  Surgeon: Corbin Ade, MD;  [facility];  Service: Endoscopy;  Laterality: N/A;  . Elease Hashimoto DILATION N/A 08/29/2018   Procedure: Elease Hashimoto DILATION;  Surgeon: Corbin Ade, MD;  [facility];  Service: Endoscopy;  Laterality: N/A;  . Plantar fasciitis     Bilateral  . POLYPECTOMY  08/29/2018   Procedure: POLYPECTOMY;  Surgeon: Corbin Ade, MD;  [facility];  Service: Endoscopy;;  cecal,descending  . VASECTOMY       Current Meds  Medication Sig  . ALPRAZolam (XANAX) 0.25 MG tablet Take 0.25 mg by mouth daily as needed for anxiety.  Marland Kitchen esomeprazole (NEXIUM) 40 MG capsule Take 40 mg by mouth daily as needed.  Marland Kitchen FLUoxetine (PROZAC) 20 MG capsule Take 20 mg by mouth daily.  . [  DISCONTINUED] ALPRAZolam (XANAX) 0.25 MG tablet Take 1 tablet (0.25 mg total) by mouth 3 (three) times daily as needed for anxiety.  . [DISCONTINUED] diltiazem (CARDIZEM CD) 120 MG 24 hr capsule Take 1 capsule (120 mg total) by mouth daily.     Allergies:   Patient has no known allergies.   Social History   Tobacco Use  . Smoking status: Never Smoker  . Smokeless tobacco: Former Network engineer  . Vaping Use: Never used  Substance Use Topics  . Alcohol use: Yes    Alcohol/week: 0.0 standard drinks     Comment: occas  . Drug use: No     Family Hx: The patient's family history includes Heart failure in his father. There is no history of Colon cancer.  ROS:   Please see the history of present illness.    Labs/Other Tests and Data Reviewed:    EKG:  No ECG reviewed.  Recent Labs: No results found for requested labs within last 8760 hours.   Labs obtained through [facility]- personally reviewed and interpreted: 09/17/2019: Total cholesterol 220, triglycerides 203, HDL 50, LDL 142, creatinine 1.04, K+ 5, ALT 17  Recent Lipid Panel No results found for: CHOL, TRIG, HDL, CHOLHDL, LDLCALC, LDLDIRECT  Wt Readings from Last 3 Encounters:  11/29/20 220 lb (99.8 kg)  11/05/18 218 lb (98.9 kg)  10/23/18 216 lb 3.2 oz (98.1 kg)     Risk Assessment/Calculations:      Objective:    Vital Signs:  Ht 6\' 1"  (1.854 m)   Wt 220 lb (99.8 kg)   BMI 29.03 kg/m    VITAL SIGNS:  reviewed GEN:  no acute distress EYES:  sclerae anicteric, EOMI - Extraocular Movements Intact RESPIRATORY:  normal respiratory effort, symmetric expansion NEURO:  alert and oriented x 3, no obvious focal deficit  ASSESSMENT & PLAN:    1. PVC (premature ventricular contraction) Overall well controlled on diltiazem as needed.  Continue current therapy.  Follow-up 1 year, in person with an EKG.  2. Hyperlipidemia, unspecified hyperlipidemia type LDL 142 and triglycerides 203.   Cholesterol is managed by PCP.   We discussed the importance of plant-based diet with high-fiber foods, low saturated fats and continued exercise.       Time:   Today, I have spent 8 minutes with the patient with telehealth technology discussing the above problems.     Medication Adjustments/Labs and Tests Ordered: Current medicines are reviewed at length with the patient today.  Concerns regarding medicines are outlined above.   Tests Ordered: No orders of the defined types were placed in this encounter.   Medication  Changes: Meds ordered this encounter  Medications  . diltiazem (CARDIZEM CD) 120 MG 24 hr capsule    Sig: Take 1 capsule (120 mg total) by mouth daily.    Dispense:  90 capsule    Refill:  3    Follow Up:  In Person in 1 year(s)  Signed, Richardson Dopp, PA-C  11/29/2020 12:36 PM    [place]

## 2020-11-29 ENCOUNTER — Other Ambulatory Visit: Payer: Self-pay

## 2020-11-29 ENCOUNTER — Telehealth (INDEPENDENT_AMBULATORY_CARE_PROVIDER_SITE_OTHER): Payer: BC Managed Care – PPO | Admitting: Physician Assistant

## 2020-11-29 ENCOUNTER — Encounter: Payer: Self-pay | Admitting: Physician Assistant

## 2020-11-29 VITALS — Ht 73.0 in | Wt 220.0 lb

## 2020-11-29 DIAGNOSIS — I493 Ventricular premature depolarization: Secondary | ICD-10-CM | POA: Diagnosis not present

## 2020-11-29 DIAGNOSIS — E785 Hyperlipidemia, unspecified: Secondary | ICD-10-CM | POA: Diagnosis not present

## 2020-11-29 MED ORDER — DILTIAZEM HCL ER COATED BEADS 120 MG PO CP24: 120.0000 mg | ORAL_CAPSULE | Freq: Every day | ORAL | 3 refills | Status: DC

## 2020-11-29 NOTE — Patient Instructions (Signed)
Medication Instructions:  Your physician recommends that you continue on your current medications as directed. Please refer to the Current Medication list given to you today.  *If you need a refill on your cardiac medications before your next appointment, please call your pharmacy*  Lab Work: None ordered today  Testing/Procedures: None ordered today  Follow-Up: At [facility], you and your health needs are our priority.  As part of our continuing mission to provide you with exceptional heart care, we have created designated Provider Care Teams.  These Care Teams include your primary Cardiologist (physician) and Advanced Practice Providers (APPs -  Physician Assistants and Nurse Practitioners) who all work together to provide you with the care you need, when you need it.  Your next appointment:   12 month(s)  The format for your next appointment:   In Person  Provider:   Christopher McAlhany, MD  

## 2021-08-23 ENCOUNTER — Encounter: Payer: Self-pay | Admitting: Physician Assistant

## 2021-08-23 NOTE — Progress Notes (Signed)
Cardiology Office Note    Date:  08/25/2021   ID:  Warren Barr, DOB 1966/04/27, MRN [id number]  PCP:  Nicholes Rough, PA-C  Cardiologist:  Lauree Chandler, MD  Electrophysiologist:  None   Chief Complaint: f/u PACs, PVCs  History of Present Illness:   Warren Barr is a 55 y.o. male with history of PACs, PVCs, HLD (managed by primary care), anxiety who presents for routine follow-up. Remote Holter 2013 showed 9.5% PVCs and rare PACs. 2D echo 2013 showed EF 60-65% otherwise no significant abnormalities noted. TSH was normal at that time. He has been managed on diltiazem.  He returns for follow-up today. He states the PVCs were originally diagnosed around a time with a lot of other things going on and once he got his anxiety treated, the palpitations improved. Overall he still feels he is doing well. He has been off his diltiazem since the summer, only taking PRN at this point. His palpitations are infrequent but when they do occur, they can last for several hours. Still, he states, it is nothing like what it was back in 2013. He was encouraged to return for follow-up by his siblings as he has learned he has a significant family history of atrial fib in multiple family members. He is also now being treated by primary care for HLD.  Labwork independently reviewed: CareEverywhere 08/16/21 K 4.8, Cr 1.09, LFTs ok, LDL 103, 06/2019 CBC wnl, LDL 179 2019 K 4.1, Cr 1.20, CBC wnl   Past Medical History:  Diagnosis Date   Anxiety states    Atypical mole 07/07/2010   RIGHT THIGH WIDER SHAVE   Atypical nevi 04/28/2020   LEFT PARIETAL SCALP EXC ATYPICAL ADEXAL   Dizziness    Hyperlipidemia    Premature atrial contractions    PVC (premature ventricular contraction)    SCCA (squamous cell carcinoma) of skin 09/12/2018   RIGHT CROWN SCALP-CX3,5FU   SCCA (squamous cell carcinoma) of skin 05/27/2019   RIGHT CROWN SCALP-TXpBX    Past Surgical History:  Procedure Laterality Date    COLONOSCOPY N/A 12/13/2015   Procedure: COLONOSCOPY;  Surgeon: Daneil Dolin, MD;  [facility];  Service: Endoscopy;  Laterality: N/A;  245 - moved to 1:45 - office to notify   COLONOSCOPY WITH PROPOFOL N/A 08/29/2018   Procedure: COLONOSCOPY WITH PROPOFOL;  Surgeon: Daneil Dolin, MD;  [facility];  Service: Endoscopy;  Laterality: N/A;  9:15am   ESOPHAGOGASTRODUODENOSCOPY (EGD) WITH PROPOFOL N/A 08/29/2018   Procedure: ESOPHAGOGASTRODUODENOSCOPY (EGD) WITH PROPOFOL;  Surgeon: Daneil Dolin, MD;  [facility];  Service: Endoscopy;  Laterality: N/A;   MALONEY DILATION N/A 08/29/2018   Procedure: Venia Minks DILATION;  Surgeon: Daneil Dolin, MD;  [facility];  Service: Endoscopy;  Laterality: N/A;   Plantar fasciitis     Bilateral   POLYPECTOMY  08/29/2018   Procedure: POLYPECTOMY;  Surgeon: Daneil Dolin, MD;  [facility];  Service: Endoscopy;;  cecal,descending   VASECTOMY      Current Medications: Current Meds  Medication Sig   ALPRAZolam (XANAX) 0.25 MG tablet Take 0.25 mg by mouth daily as needed for anxiety.   diltiazem (CARDIZEM CD) 120 MG 24 hr capsule Take 1 capsule (120 mg total) by mouth daily.   esomeprazole (NEXIUM) 40 MG capsule Take 40 mg by mouth daily as needed.   FLUoxetine (PROZAC) 20 MG capsule Take 20 mg by mouth daily.   Multiple Vitamins-Minerals (CENTRUM ADULTS  PO) Take by mouth.   rosuvastatin (CRESTOR) 10 MG tablet Take 10 mg by mouth daily.  PMDP without prior bzd fill.  Allergies:   Patient has no known allergies.   Social History   Socioeconomic History   Marital status: Married    Spouse name: Not on file   Number of children: 2   Years of education: Not on file   Highest education level: Not on file  Occupational History   Occupation: Product manager: [employer]    Comment: TEFL teacher  Tobacco Use   Smoking status: Never   Smokeless tobacco: Former  Brewing technologist Use: Never used  Substance and Sexual Activity   Alcohol use: Yes    Alcohol/week: 0.0 standard drinks    Comment: occas   Drug use: No   Sexual activity: Not on file  Other Topics Concern   Not on file  Social History Narrative   Not on file   Social Determinants of Health   Financial Resource Strain: Not on file  Food Insecurity: Not on file  Transportation Needs: Not on file  Physical Activity: Not on file  Stress: Not on file  Social Connections: Not on file     Family History:  The patient's family history includes Heart failure in his father. There is no history of Colon cancer.  ROS:   Please see the history of present illness.  All other systems are reviewed and otherwise negative.    EKGs/Labs/Other Studies Reviewed:    Studies reviewed are outlined and summarized above. Reports included below if pertinent.  2d echo 02/2012 Left ventricle: Systolic function was normal. The estimated  ejection fraction was in the range of 60% to 65%.     EKG:  EKG is ordered today, personally reviewed, demonstrating NSR 83bpm, IRBBB pattern, no acute STT changes, normal QTc. Stable from prior.  Recent Labs: No results found for requested labs within last 8760 hours.  Recent Lipid Panel No results found for: CHOL, TRIG, HDL, CHOLHDL, VLDL, LDLCALC, LDLDIRECT  PHYSICAL EXAM:    VS:  BP 130/82   Pulse 83   Ht 6\' 2"  (1.88 m)   Wt 216 lb 12.8 oz (98.3 kg)   SpO2 96%   BMI 27.84 kg/m   BMI: Body mass index is 27.84 kg/m.  GEN: Well nourished, well developed male in no acute distress HEENT: normocephalic, atraumatic Neck: no JVD, carotid bruits, or masses Cardiac: RRR; no murmurs, rubs, or gallops, no edema  Respiratory:  clear to auscultation bilaterally, normal work of breathing GI: soft, nontender, nondistended, + BS MS: no deformity or atrophy Skin: warm and dry, no rash Neuro:  Alert and Oriented x 3, Strength and sensation are intact, follows  commands Psych: euthymic mood, full affect, no SI/HI  Wt Readings from Last 3 Encounters:  08/25/21 216 lb 12.8 oz (98.3 kg)  11/29/20 220 lb (99.8 kg)  11/05/18 218 lb (98.9 kg)     ASSESSMENT & PLAN:   1. Palpitations - overall sense is that he is doing well. He does still report episodic palpitations, but his overall burden seems clinically lower than back in 2013. He states it is hard to tell whether the palpitations are from PVCs or anxiety. His family has also encouraged him to return to cardiology to discuss family history of atrial fib. Will plan for 30 day monitor to exclude AF as the cause of his breakthrough palpitations. I would suggest resuming maintenance  diltiazem CD 120mg  daily.   2. PVCs/PACs - see plan above. Resume diltiazem. Will reassess burden by monitor above.  3. Hyperlipidemia - managed by primary care with recently improved LDL from 179 to 103. He also has tried to improve diet (less ice cream).  4. Anxiety - patient requested refill on alprazolam. Does not appear we manage this condition and medication for him. Have recommended he discuss with primary care and he is agreeable.  Disposition: F/u with Dr. Angelena Form in 1 year.   Medication Adjustments/Labs and Tests Ordered: Current medicines are reviewed at length with the patient today.  Concerns regarding medicines are outlined above. Medication changes, Labs and Tests ordered today are summarized above and listed in the Patient Instructions accessible in Encounters.   Signed, Charlie Pitter, PA-C  08/25/2021 11:51 AM    [address], [place], [place]  [address] Phone: [phone]; Fax: [phone]

## 2021-08-25 ENCOUNTER — Other Ambulatory Visit: Payer: Self-pay

## 2021-08-25 ENCOUNTER — Encounter: Payer: Self-pay | Admitting: Physician Assistant

## 2021-08-25 ENCOUNTER — Ambulatory Visit: Payer: BC Managed Care – PPO | Admitting: Physician Assistant

## 2021-08-25 ENCOUNTER — Encounter: Payer: Self-pay | Admitting: Radiology

## 2021-08-25 VITALS — BP 130/82 | HR 83 | Ht 74.0 in | Wt 216.8 lb

## 2021-08-25 DIAGNOSIS — R002 Palpitations: Secondary | ICD-10-CM

## 2021-08-25 DIAGNOSIS — E785 Hyperlipidemia, unspecified: Secondary | ICD-10-CM

## 2021-08-25 DIAGNOSIS — F419 Anxiety disorder, unspecified: Secondary | ICD-10-CM

## 2021-08-25 DIAGNOSIS — I491 Atrial premature depolarization: Secondary | ICD-10-CM

## 2021-08-25 DIAGNOSIS — I493 Ventricular premature depolarization: Secondary | ICD-10-CM

## 2021-08-25 MED ORDER — DILTIAZEM HCL ER COATED BEADS 120 MG PO CP24: 120.0000 mg | ORAL_CAPSULE | Freq: Every day | ORAL | 3 refills | Status: DC

## 2021-08-25 NOTE — Patient Instructions (Signed)
Medication Instructions:  Your physician has recommended you make the following change in your medication:   RESTART the Diltiazem 120 mg taking 1 every day  *If you need a refill on your cardiac medications before your next appointment, please call your pharmacy*   Lab Work: None ordered  If you have labs (blood work) drawn today and your tests are completely normal, you will receive your results only by: [place] (if you have MyChart) OR A paper copy in the mail If you have any lab test that is abnormal or we need to change your treatment, we will call you to review the results.   Testing/Procedures: [employer] Cardiac Event Monitor Instructions Your physician has requested you wear your cardiac event monitor for  30  days, (1-30). [employer] may call or text to confirm a shipping address. The monitor will be sent to a land address via UPS. [employer] will not ship a monitor to a PO BOX. It typically takes 3-5 days to receive your monitor after it has been enrolled. [employer] will assist with USPS tracking if your package is delayed. The telephone number for [employer] is [phone]. Once you have received your monitor, please review the enclosed instructions. Instruction tutorials can also be viewed under help and settings on the enclosed cell phone. Your monitor has already been registered assigning a specific monitor serial # to you.  Applying the monitor Remove cell phone from case and turn it on. The cell phone works as Dealer and needs to be within [employer] of you at all times. The cell phone will need to be charged on a daily basis. We recommend you plug the cell phone into the enclosed charger at your bedside table every night.  Monitor batteries: You will receive two monitor batteries labelled #1 and #2. These are your recorders. Plug battery #2 onto the second connection on the enclosed charger. Keep one battery on the charger at all times. This will  keep the monitor battery deactivated. It will also keep it fully charged for when you need to switch your monitor batteries. A small light will be blinking on the battery emblem when it is charging. The light on the battery emblem will remain on when the battery is fully charged.  Open package of a Monitor strip. Insert battery #1 into black hood on strip and gently squeeze monitor battery onto connection as indicated in instruction booklet. Set aside while preparing skin.  Choose location for your strip, vertical or horizontal, as indicated in the instruction booklet. Shave to remove all hair from location. There cannot be any lotions, oils, powders, or colognes on skin where monitor is to be applied. Wipe skin clean with enclosed Saline wipe. Dry skin completely.  Peel paper labeled #1 off the back of the Monitor strip exposing the adhesive. Place the monitor on the chest in the vertical or horizontal position shown in the instruction booklet. One arrow on the monitor strip must be pointing upward. Carefully remove paper labeled #2, attaching remainder of strip to your skin. Try not to create any folds or wrinkles in the strip as you apply it.  Firmly press and release the circle in the center of the monitor battery. You will hear a small beep. This is turning the monitor battery on. The heart emblem on the monitor battery will light up every 5 seconds if the monitor battery in turned on and connected to the patient securely. Do not push and hold the circle down as this  turns the monitor battery off. The cell phone will locate the monitor battery. A screen will appear on the cell phone checking the connection of your monitor strip. This may read poor connection initially but change to good connection within the next minute. Once your monitor accepts the connection you will hear a series of 3 beeps followed by a climbing crescendo of beeps. A screen will appear on the cell phone showing the  two monitor strip placement options. Touch the picture that demonstrates where you applied the monitor strip.  Your monitor strip and battery are waterproof. You are able to shower, bathe, or swim with the monitor on. They just ask you do not submerge deeper than 3 feet underwater. We recommend removing the monitor if you are swimming in a lake, river, or ocean.  Your monitor battery will need to be switched to a fully charged monitor battery approximately once a week. The cell phone will alert you of an action which needs to be made.  On the cell phone, tap for details to reveal connection status, monitor battery status, and cell phone battery status. The green dots indicates your monitor is in good status. A red dot indicates there is something that needs your attention.  To record a symptom, click the circle on the monitor battery. In 30-60 seconds a list of symptoms will appear on the cell phone. Select your symptom and tap save. Your monitor will record a sustained or significant arrhythmia regardless of you clicking the button. Some patients do not feel the heart rhythm irregularities. [employer] will notify us of any serious or critical events.  Refer to instruction booklet for instructions on switching batteries, changing strips, the Do not disturb or Pause features, or any additional questions.  Call [employer] at [phone], to confirm your monitor is transmitting and record your baseline. They will answer any questions you may have regarding the monitor instructions at that time.  Returning the monitor to [place] all equipment back into blue box. Peel off strip of paper to expose adhesive and close box securely. There is a prepaid UPS shipping label on this box. Drop in a UPS drop box, or at a UPS facility like [employer]. You may also contact [employer] to arrange UPS to pick up monitor package at your home.    Follow-Up: At [facility], you and your health  needs are our priority.  As part of our continuing mission to provide you with exceptional heart care, we have created designated Provider Care Teams.  These Care Teams include your primary Cardiologist (physician) and Advanced Practice Providers (APPs -  Physician Assistants and Nurse Practitioners) who all work together to provide you with the care you need, when you need it.  We recommend signing up for the patient portal called "MyChart".  Sign up information is provided on this After Visit Summary.  MyChart is used to connect with patients for Virtual Visits (Telemedicine).  Patients are able to view lab/test results, encounter notes, upcoming appointments, etc.  Non-urgent messages can be sent to your provider as well.   To learn more about what you can do with MyChart, go to [email or web address].    Your next appointment:   12 month(s)  The format for your next appointment:   In Person  Provider:   You may see Lauree Chandler, MD or one of the following Advanced Practice Providers on your designated Care Team:   Melina Copa, PA-C Ermalinda Barrios, PA-C   Other Instructions Jodelle Red is the  device you discussed today

## 2021-08-25 NOTE — Progress Notes (Signed)
Patient ID: Warren Barr, male   DOB: [birth year], 55 y.o.   MRN: [id number] Enrolled patient for a 30 day [employer] Event Monitor to be mailed to patients home

## 2021-09-03 ENCOUNTER — Ambulatory Visit (INDEPENDENT_AMBULATORY_CARE_PROVIDER_SITE_OTHER): Payer: BC Managed Care – PPO

## 2021-09-03 DIAGNOSIS — R002 Palpitations: Secondary | ICD-10-CM

## 2021-09-03 DIAGNOSIS — I493 Ventricular premature depolarization: Secondary | ICD-10-CM

## 2021-09-03 DIAGNOSIS — I491 Atrial premature depolarization: Secondary | ICD-10-CM

## 2021-10-05 ENCOUNTER — Other Ambulatory Visit: Payer: Self-pay | Admitting: Physician Assistant

## 2021-10-05 DIAGNOSIS — I491 Atrial premature depolarization: Secondary | ICD-10-CM

## 2021-10-05 DIAGNOSIS — R002 Palpitations: Secondary | ICD-10-CM

## 2021-10-05 DIAGNOSIS — I493 Ventricular premature depolarization: Secondary | ICD-10-CM

## 2021-10-07 ENCOUNTER — Ambulatory Visit: Payer: BC Managed Care – PPO | Admitting: Nurse Practitioner

## 2021-12-19 ENCOUNTER — Ambulatory Visit: Payer: BC Managed Care – PPO | Admitting: Gastroenterology

## 2022-10-09 ENCOUNTER — Ambulatory Visit: Payer: Self-pay | Attending: Cardiovascular Disease | Admitting: Cardiovascular Disease

## 2022-10-09 NOTE — Progress Notes (Deleted)
No chief complaint on file.  History of Present Illness: 56 yo male with history of chest pain and PVCs/PACs who is here today for cardiac follow up. I saw him as a new patient 02/26/12 for evaluation of chest pain and palpitations. I arranged an echo in 2013 which showed normal LV systolic function with LVEF of 60%. No valvular disease. His 48 hour Holter monitor showed NSR with 23,000 PVCs and 240 PACs. He was started on Cardizem. He was seen in our office in September 2022 and had c/o palpitations since stopping Cardizem. Cardiac monitor in October 2022 with sinus, rare PVCs and no evidence of atrial fibrillation. Cardizem was restarted.   He is here today for follow up. The patient denies any chest pain, dyspnea, palpitations, lower extremity edema, orthopnea, PND, dizziness, near syncope or syncope.   Primary Care Physician: Nicholes Rough, PA-C  Past Medical History:  Diagnosis Date   Anxiety states    Atypical mole 07/07/2010   RIGHT THIGH WIDER SHAVE   Atypical nevi 04/28/2020   LEFT PARIETAL SCALP EXC ATYPICAL ADEXAL   Dizziness    Hyperlipidemia    Premature atrial contractions    PVC (premature ventricular contraction)    SCCA (squamous cell carcinoma) of skin 09/12/2018   RIGHT CROWN SCALP-CX3,5FU   SCCA (squamous cell carcinoma) of skin 05/27/2019   RIGHT CROWN SCALP-TXpBX    Past Surgical History:  Procedure Laterality Date   COLONOSCOPY N/A 12/13/2015   Procedure: COLONOSCOPY;  Surgeon: Daneil Dolin, MD;  [facility];  Service: Endoscopy;  Laterality: N/A;  245 - moved to 1:45 - office to notify   COLONOSCOPY WITH PROPOFOL N/A 08/29/2018   Procedure: COLONOSCOPY WITH PROPOFOL;  Surgeon: Daneil Dolin, MD;  [facility];  Service: Endoscopy;  Laterality: N/A;  9:15am   ESOPHAGOGASTRODUODENOSCOPY (EGD) WITH PROPOFOL N/A 08/29/2018   Procedure: ESOPHAGOGASTRODUODENOSCOPY (EGD) WITH PROPOFOL;  Surgeon: Daneil Dolin, MD;  [facility];   Service: Endoscopy;  Laterality: N/A;   MALONEY DILATION N/A 08/29/2018   Procedure: Venia Minks DILATION;  Surgeon: Daneil Dolin, MD;  [facility];  Service: Endoscopy;  Laterality: N/A;   Plantar fasciitis     Bilateral   POLYPECTOMY  08/29/2018   Procedure: POLYPECTOMY;  Surgeon: Daneil Dolin, MD;  [facility];  Service: Endoscopy;;  cecal,descending   VASECTOMY      Current Outpatient Medications  Medication Sig Dispense Refill   ALPRAZolam (XANAX) 0.25 MG tablet Take 0.25 mg by mouth daily as needed for anxiety.     celecoxib (CELEBREX) 200 MG capsule Take 200 mg by mouth daily. (Patient not taking: Reported on 08/25/2021)     diltiazem (CARDIZEM CD) 120 MG 24 hr capsule Take 1 capsule (120 mg total) by mouth daily. 90 capsule 3   esomeprazole (NEXIUM) 40 MG capsule Take 40 mg by mouth daily as needed.     FLUoxetine (PROZAC) 20 MG capsule Take 20 mg by mouth daily.     Multiple Vitamins-Minerals (CENTRUM ADULTS PO) Take by mouth.     rosuvastatin (CRESTOR) 10 MG tablet Take 10 mg by mouth daily.     No current facility-administered medications for this visit.    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  Tobacco Use   Smoking status: Never   Smokeless tobacco: Former  Scientific laboratory technician Use: Never used  Substance and Sexual Activity   Alcohol use: Yes    Alcohol/week: 0.0 standard drinks of alcohol    Comment: occas   Drug use: No   Sexual activity: Not on file  Other Topics Concern   Not on file  Social History Narrative   Not on file   Social Determinants of Health   Financial Resource Strain: Not on file  Food Insecurity: Not on file  Transportation  Needs: Not on file  Physical Activity: Not on file  Stress: Not on file  Social Connections: Not on file  Intimate Partner Violence: Not on file    Family History  Problem Relation Age of Onset   Heart failure Father    Colon cancer Neg Hx     Review of Systems:  As stated in the HPI and otherwise negative.   There were no vitals taken for this visit.  Physical Examination: General: Well developed, well nourished, NAD  HEENT: OP clear, mucus membranes moist  SKIN: warm, dry. No rashes. Neuro: No focal deficits  Musculoskeletal: Muscle strength 5/5 all ext  Psychiatric: Mood and affect normal  Neck: No JVD, no carotid bruits, no thyromegaly, no lymphadenopathy.  Lungs:Clear bilaterally, no wheezes, rhonci, crackles Cardiovascular: Regular rate and rhythm. No murmurs, gallops or rubs. Abdomen:Soft. Bowel sounds present. Non-tender.  Extremities: No lower extremity edema. Pulses are 2 + in the bilateral DP/PT.  EKG:  EKG is *** ordered today. The ekg ordered today demonstrates   Recent Labs: No results found for requested labs within last 365 days.   Lipid Panel No results found for: "CHOL", "TRIG", "HDL", "CHOLHDL", "VLDL", "LDLCALC", "LDLDIRECT"   Wt Readings from Last 3 Encounters:  08/25/21 216 lb 12.8 oz (98.3 kg)  11/29/20 220 lb (99.8 kg)  11/05/18 218 lb (98.9 kg)     Assessment and Plan:   1. PVCs/PACs: Rare palpitations. Continue Cardizem.   2. Hyperlipidemia: Managed in primary care.   Labs/ tests ordered today include:  No orders of the defined types were placed in this encounter.   Disposition:   F/U with me in 12  months  Signed, Lauree Chandler, MD 10/09/2022 11:39 AM    [address], [place], [place]  [address] Phone: [phone]; Fax: [phone]

## 2022-12-05 ENCOUNTER — Other Ambulatory Visit: Payer: Self-pay | Admitting: Physician Assistant

## 2022-12-27 ENCOUNTER — Ambulatory Visit: Payer: BC Managed Care – PPO | Admitting: Cardiovascular Disease

## 2023-01-11 ENCOUNTER — Other Ambulatory Visit: Payer: Self-pay

## 2023-01-11 MED ORDER — DILTIAZEM HCL ER COATED BEADS 120 MG PO CP24: ORAL_CAPSULE | ORAL | 0 refills | Status: DC

## 2023-02-15 ENCOUNTER — Other Ambulatory Visit: Payer: Self-pay | Admitting: Cardiovascular Disease

## 2023-03-06 ENCOUNTER — Other Ambulatory Visit: Payer: Self-pay | Admitting: Cardiovascular Disease

## 2023-10-12 ENCOUNTER — Other Ambulatory Visit: Payer: Self-pay | Admitting: Cardiovascular Disease

## 2023-10-29 ENCOUNTER — Ambulatory Visit: Payer: 59 | Attending: Cardiovascular Disease | Admitting: Cardiovascular Disease

## 2023-10-29 ENCOUNTER — Encounter: Payer: Self-pay | Admitting: Cardiovascular Disease

## 2023-10-29 VITALS — BP 130/76 | HR 66 | Ht 74.0 in | Wt 223.6 lb

## 2023-10-29 DIAGNOSIS — I493 Ventricular premature depolarization: Secondary | ICD-10-CM | POA: Diagnosis not present

## 2023-10-29 MED ORDER — DILTIAZEM HCL ER COATED BEADS 120 MG PO CP24: ORAL_CAPSULE | ORAL | 3 refills | Status: DC

## 2023-10-29 NOTE — Progress Notes (Signed)
Chief Complaint  Patient presents with   Follow-up    PVCs   History of Present Illness: 57 yo male with history of PVCs/PACs, HLD and anxiety who is here today for cardiac follow up. I saw him as a new patient in April 2013 for evaluation of palpitations. Echo in 2013 showed normal LV systolic function with LVEF of 60%. No valvular disease. His 48 hour Holter monitor in 2013 showed sinus rhythm with 23,000 PVCs and 240 PACs. He was started on Cardizem. Cardiac monitor in 2022 with sinus, rare PVCs.   He is here today for follow up. The patient denies any chest pain, dyspnea, palpitations, lower extremity edema, orthopnea, PND, dizziness, near syncope or syncope. He is active.   Primary Care Physician: Ladora Daniel, PA-C  Past Medical History:  Diagnosis Date   Anxiety states    Atypical mole 07/07/2010   RIGHT THIGH WIDER SHAVE   Atypical nevi 04/28/2020   LEFT PARIETAL SCALP EXC ATYPICAL ADEXAL   Dizziness    Hyperlipidemia    Premature atrial contractions    PVC (premature ventricular contraction)    SCCA (squamous cell carcinoma) of skin 09/12/2018   RIGHT CROWN SCALP-CX3,5FU   SCCA (squamous cell carcinoma) of skin 05/27/2019   RIGHT CROWN SCALP-TXpBX    Past Surgical History:  Procedure Laterality Date   COLONOSCOPY N/A 12/13/2015   Procedure: COLONOSCOPY;  Surgeon: Corbin Ade, MD;  [facility];  Service: Endoscopy;  Laterality: N/A;  245 - moved to 1:45 - office to notify   COLONOSCOPY WITH PROPOFOL N/A 08/29/2018   Procedure: COLONOSCOPY WITH PROPOFOL;  Surgeon: Corbin Ade, MD;  [facility];  Service: Endoscopy;  Laterality: N/A;  9:15am   ESOPHAGOGASTRODUODENOSCOPY (EGD) WITH PROPOFOL N/A 08/29/2018   Procedure: ESOPHAGOGASTRODUODENOSCOPY (EGD) WITH PROPOFOL;  Surgeon: Corbin Ade, MD;  [facility];  Service: Endoscopy;  Laterality: N/A;   MALONEY DILATION N/A 08/29/2018   Procedure: Elease Hashimoto DILATION;  Surgeon: Corbin Ade, MD;  [facility];  Service: Endoscopy;  Laterality: N/A;   Plantar fasciitis     Bilateral   POLYPECTOMY  08/29/2018   Procedure: POLYPECTOMY;  Surgeon: Corbin Ade, MD;  [facility];  Service: Endoscopy;;  cecal,descending   VASECTOMY      Current Outpatient Medications  Medication Sig Dispense Refill   FLUoxetine (PROZAC) 20 MG capsule Take 20 mg by mouth daily.     Multiple Vitamins-Minerals (CENTRUM ADULTS PO) Take by mouth.     diltiazem (CARDIZEM CD) 120 MG 24 hr capsule TAKE 1 CAPSULE(120 MG) BY MOUTH DAILY 90 capsule 3   No current facility-administered medications for this visit.    No Known Allergies  Social History   Socioeconomic History   Marital status: Married    Spouse name: Not on file   Number of children: 2   Years of education: Not on file   Highest education level: Not on file  Occupational History   Occupation: Magazine features editor: [employer]    Comment: Designer, multimedia  Tobacco Use   Smoking status: Never   Smokeless tobacco: Former  Building services engineer status: Never Used  Substance and Sexual Activity   Alcohol use: Yes    Alcohol/week: 0.0 standard drinks of alcohol    Comment: occas   Drug use: No   Sexual activity: Not on file  Other Topics Concern   Not on file  Social History Narrative   Not on file   Social Determinants of Health   Financial Resource Strain: Low Risk  (05/22/2023)   Received from [facility]   Overall Financial Resource Strain (CARDIA)    Difficulty of Paying Living Expenses: Not hard at all  Food Insecurity: No Food Insecurity (05/22/2023)   [facility]   Hunger Vital Sign    Worried About Running Out of Food in the Last Year: Never true    Ran Out of Food in the Last Year: Never true  Transportation Needs: No Transportation Needs (05/22/2023)   Received from [facility] - Transportation    Lack of Transportation (Medical): No     Lack of Transportation (Non-Medical): No  Physical Activity: Sufficiently Active (05/22/2023)   [facility] from [facility]   Exercise Vital Sign    Days of Exercise per Week: 7 days    Minutes of Exercise per Session: 60 min  Stress: No Stress Concern Present (05/22/2023)   Received from [facility] - Occupational Stress Questionnaire    Feeling of Stress : Not at all  Social Connections: Moderately Integrated (05/22/2023)   Received from [facility]   Social Network    How would you rate your social network (family, work, friends)?: Adequate participation with social networks  Intimate Partner Violence: Not At Risk (05/22/2023)   Received from [facility]   HITS    Over the last 12 months how often did your partner physically hurt you?: Never    Over the last 12 months how often did your partner insult you or talk down to you?: Never    Over the last 12 months how often did your partner threaten you with physical harm?: Never    Over the last 12 months how often did your partner scream or curse at you?: Never    Family History  Problem Relation Age of Onset   Heart failure Father    Colon cancer Neg Hx     Review of Systems:  As stated in the HPI and otherwise negative.   BP 130/76   Pulse 66   Ht 6\' 2"  (1.88 m)   Wt 101.4 kg   SpO2 97%   BMI 28.71 kg/m   Physical Examination: General: Well developed, well nourished, NAD  HEENT: OP clear, mucus membranes moist  SKIN: warm, dry. No rashes. Neuro: No focal deficits  Musculoskeletal: Muscle strength 5/5 all ext  Psychiatric: Mood and affect normal  Neck: No JVD, no carotid bruits, no thyromegaly, no lymphadenopathy.  Lungs:Clear bilaterally, no wheezes, rhonci, crackles Cardiovascular: Regular rate and rhythm. No murmurs, gallops or rubs. Abdomen:Soft. Bowel sounds present. Non-tender.  Extremities: No lower extremity edema. Pulses are 2 + in the bilateral DP/PT.  EKG:   EKG is ordered today. The ekg ordered today demonstrates  EKG Interpretation Date/Time:  Monday October 29 2023 14:09:37 EST Ventricular Rate:  66 PR Interval:  126 QRS Duration:  102 QT Interval:  412 QTC Calculation: 431 R Axis:   70  Text Interpretation: Normal sinus rhythm Normal ECG No previous ECGs available Confirmed by Verne Carrow [phone]) on 10/29/2023 2:10:28 PM    Recent Labs: No results found for requested labs within last 365 days.   Lipid Panel No results found for: "CHOL", "TRIG", "HDL", "CHOLHDL", "VLDL", "LDLCALC", "LDLDIRECT"   Wt Readings from Last 3 Encounters:  10/29/23 101.4 kg  08/25/21 98.3 kg  11/29/20 99.8  kg    Assessment and Plan:   1. PVCs/PACs: Rare palpitations. He is doing well. Continue Cardizem.   Labs/ tests ordered today include:   Orders Placed This Encounter  Procedures   EKG 12-Lead   Disposition:   F/U with me in 12  months  Signed, Verne Carrow, MD 10/29/2023 3:40 PM    [facility] [address] [place], [place], [place]  [address] Phone: [phone]; Fax: [phone]

## 2023-10-29 NOTE — Patient Instructions (Signed)
Medication Instructions:  No changes *If you need a refill on your cardiac medications before your next appointment, please call your pharmacy*   Lab Work: none If you have labs (blood work) drawn today and your tests are completely normal, you will receive your results only by: MyChart Message (if you have MyChart) OR A paper copy in the mail If you have any lab test that is abnormal or we need to change your treatment, we will call you to review the results.   Testing/Procedures: No changes   Follow-Up: At [facility], you and your health needs are our priority.  As part of our continuing mission to provide you with exceptional heart care, we have created designated Provider Care Teams.  These Care Teams include your primary Cardiologist (physician) and Advanced Practice Providers (APPs -  Physician Assistants and Nurse Practitioners) who all work together to provide you with the care you need, when you need it.   Your next appointment:   12 month(s)  Provider:   Verne Carrow, MD

## 2024-11-20 ENCOUNTER — Other Ambulatory Visit: Payer: Self-pay | Admitting: Cardiovascular Disease

## 2024-11-21 ENCOUNTER — Other Ambulatory Visit: Payer: Self-pay | Admitting: Cardiovascular Disease

## 2024-11-25 ENCOUNTER — Other Ambulatory Visit: Payer: Self-pay

## 2024-11-25 MED ORDER — DILTIAZEM HCL ER COATED BEADS 120 MG PO CP24: ORAL_CAPSULE | ORAL | 0 refills | Status: AC
